# Patient Record
Sex: Female | Born: 1955
Health system: Southern US, Community
[De-identification: ages and names within clinical notes are randomized; demographics above are authoritative.]

## PROBLEM LIST (undated history)

## (undated) DIAGNOSIS — M810 Age-related osteoporosis without current pathological fracture: Secondary | ICD-10-CM

## (undated) DIAGNOSIS — K7689 Other specified diseases of liver: Secondary | ICD-10-CM

## (undated) DIAGNOSIS — Z972 Presence of dental prosthetic device (complete) (partial): Secondary | ICD-10-CM

## (undated) DIAGNOSIS — B009 Herpesviral infection, unspecified: Secondary | ICD-10-CM

## (undated) DIAGNOSIS — R748 Abnormal levels of other serum enzymes: Secondary | ICD-10-CM

## (undated) DIAGNOSIS — K219 Gastro-esophageal reflux disease without esophagitis: Secondary | ICD-10-CM

## (undated) DIAGNOSIS — M1990 Unspecified osteoarthritis, unspecified site: Secondary | ICD-10-CM

## (undated) HISTORY — DX: Unspecified osteoarthritis, unspecified site: M19.90

## (undated) HISTORY — DX: Herpesviral infection, unspecified: B00.9

## (undated) HISTORY — DX: Age-related osteoporosis without current pathological fracture: M81.0

## (undated) HISTORY — DX: Presence of dental prosthetic device (complete) (partial): Z97.2

## (undated) HISTORY — DX: Other specified diseases of liver: K76.89

## (undated) HISTORY — DX: Abnormal levels of other serum enzymes: R74.8

## (undated) HISTORY — PX: COLONOSCOPY: SHX174

## (undated) HISTORY — PX: TUBAL LIGATION: SHX77

## (undated) HISTORY — PX: VAGINAL HYSTERECTOMY: SUR661

## (undated) HISTORY — DX: Gastro-esophageal reflux disease without esophagitis: K21.9

## (undated) HISTORY — PX: MULTIPLE TOOTH EXTRACTIONS: SHX2053

## (undated) HISTORY — PX: OTHER SURGICAL HISTORY: SHX169

## (undated) HISTORY — PX: WISDOM TOOTH EXTRACTION: SHX21

---

## 1997-11-17 ENCOUNTER — Ambulatory Visit (HOSPITAL_COMMUNITY): Admission: RE | Admit: 1997-11-17 | Discharge: 1997-11-17 | Payer: Self-pay | Admitting: Obstetrics and Gynecology

## 2000-02-14 ENCOUNTER — Ambulatory Visit (HOSPITAL_BASED_OUTPATIENT_CLINIC_OR_DEPARTMENT_OTHER): Admission: RE | Admit: 2000-02-14 | Discharge: 2000-02-14 | Payer: Self-pay | Admitting: Orthopedic Surgery

## 2000-04-16 ENCOUNTER — Ambulatory Visit (HOSPITAL_BASED_OUTPATIENT_CLINIC_OR_DEPARTMENT_OTHER): Admission: RE | Admit: 2000-04-16 | Discharge: 2000-04-16 | Payer: Self-pay | Admitting: Orthopedic Surgery

## 2000-04-29 ENCOUNTER — Other Ambulatory Visit: Admission: RE | Admit: 2000-04-29 | Discharge: 2000-04-29 | Payer: Self-pay | Admitting: Obstetrics and Gynecology

## 2001-05-20 ENCOUNTER — Encounter: Payer: Self-pay | Admitting: Obstetrics and Gynecology

## 2001-05-20 ENCOUNTER — Ambulatory Visit (HOSPITAL_COMMUNITY): Admission: RE | Admit: 2001-05-20 | Discharge: 2001-05-20 | Payer: Self-pay | Admitting: Obstetrics and Gynecology

## 2001-05-26 ENCOUNTER — Encounter: Admission: RE | Admit: 2001-05-26 | Discharge: 2001-05-26 | Payer: Self-pay | Admitting: Obstetrics and Gynecology

## 2001-05-26 ENCOUNTER — Encounter: Payer: Self-pay | Admitting: Obstetrics and Gynecology

## 2005-06-17 ENCOUNTER — Observation Stay (HOSPITAL_COMMUNITY): Admission: RE | Admit: 2005-06-17 | Discharge: 2005-06-18 | Payer: Self-pay | Admitting: Obstetrics and Gynecology

## 2005-06-17 ENCOUNTER — Encounter (INDEPENDENT_AMBULATORY_CARE_PROVIDER_SITE_OTHER): Payer: Self-pay | Admitting: Specialist

## 2005-08-16 ENCOUNTER — Encounter: Admission: RE | Admit: 2005-08-16 | Discharge: 2005-08-16 | Payer: Self-pay | Admitting: Obstetrics and Gynecology

## 2006-09-18 ENCOUNTER — Encounter: Admission: RE | Admit: 2006-09-18 | Discharge: 2006-09-18 | Payer: Self-pay | Admitting: Obstetrics and Gynecology

## 2007-12-02 ENCOUNTER — Encounter: Admission: RE | Admit: 2007-12-02 | Discharge: 2007-12-02 | Payer: Self-pay | Admitting: Obstetrics and Gynecology

## 2008-02-03 ENCOUNTER — Ambulatory Visit: Payer: Self-pay | Admitting: Internal Medicine

## 2008-02-03 DIAGNOSIS — K625 Hemorrhage of anus and rectum: Secondary | ICD-10-CM | POA: Insufficient documentation

## 2008-02-03 DIAGNOSIS — K5900 Constipation, unspecified: Secondary | ICD-10-CM | POA: Insufficient documentation

## 2008-03-18 ENCOUNTER — Ambulatory Visit: Payer: Self-pay | Admitting: Internal Medicine

## 2008-03-18 ENCOUNTER — Encounter: Payer: Self-pay | Admitting: Internal Medicine

## 2008-03-27 ENCOUNTER — Encounter: Payer: Self-pay | Admitting: Internal Medicine

## 2008-05-27 ENCOUNTER — Ambulatory Visit (HOSPITAL_COMMUNITY): Admission: RE | Admit: 2008-05-27 | Discharge: 2008-05-27 | Payer: Self-pay | Admitting: Family Medicine

## 2008-05-27 ENCOUNTER — Encounter: Admission: RE | Admit: 2008-05-27 | Discharge: 2008-05-27 | Payer: Self-pay | Admitting: Family Medicine

## 2008-12-29 ENCOUNTER — Encounter: Admission: RE | Admit: 2008-12-29 | Discharge: 2008-12-29 | Payer: Self-pay | Admitting: Obstetrics and Gynecology

## 2009-07-07 ENCOUNTER — Ambulatory Visit (HOSPITAL_BASED_OUTPATIENT_CLINIC_OR_DEPARTMENT_OTHER): Admission: RE | Admit: 2009-07-07 | Discharge: 2009-07-07 | Payer: Self-pay | Admitting: Orthopedic Surgery

## 2009-08-05 IMAGING — MG MM SCREEN MAMMOGRAM BILATERAL
4 series · 4 of 4 positions shown · non-contrast
Comparison: none

DG SCREEN MAMMOGRAM BILATERAL
Bilateral CC and MLO view(s) were taken.

DIGITAL SCREENING MAMMOGRAM WITH CAD:
The breast tissue is heterogeneously dense.  No masses or malignant type calcifications are 
identified.  Compared with prior studies.

[R CC]
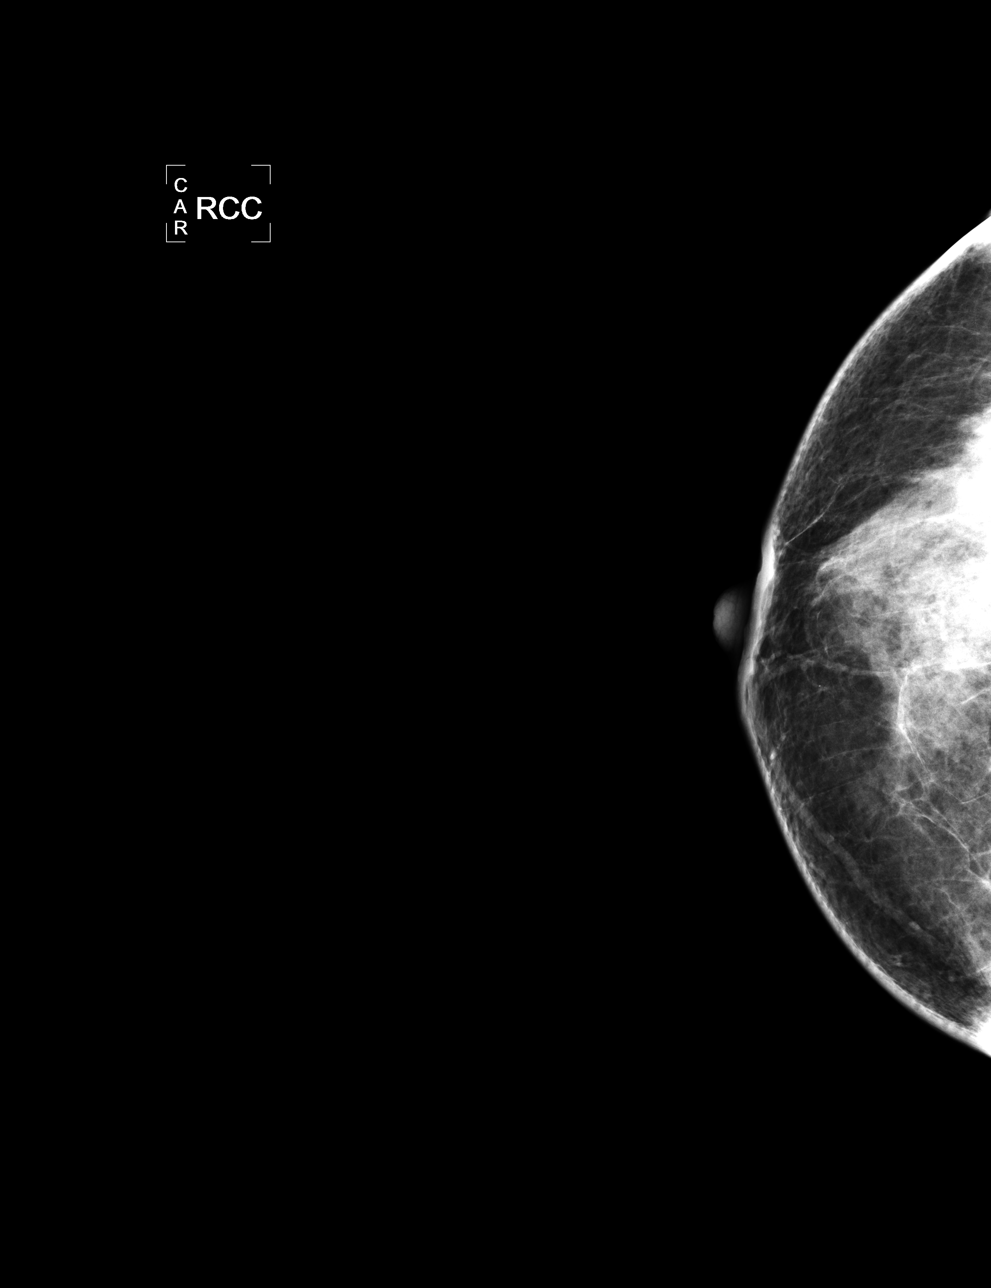

[L CC]
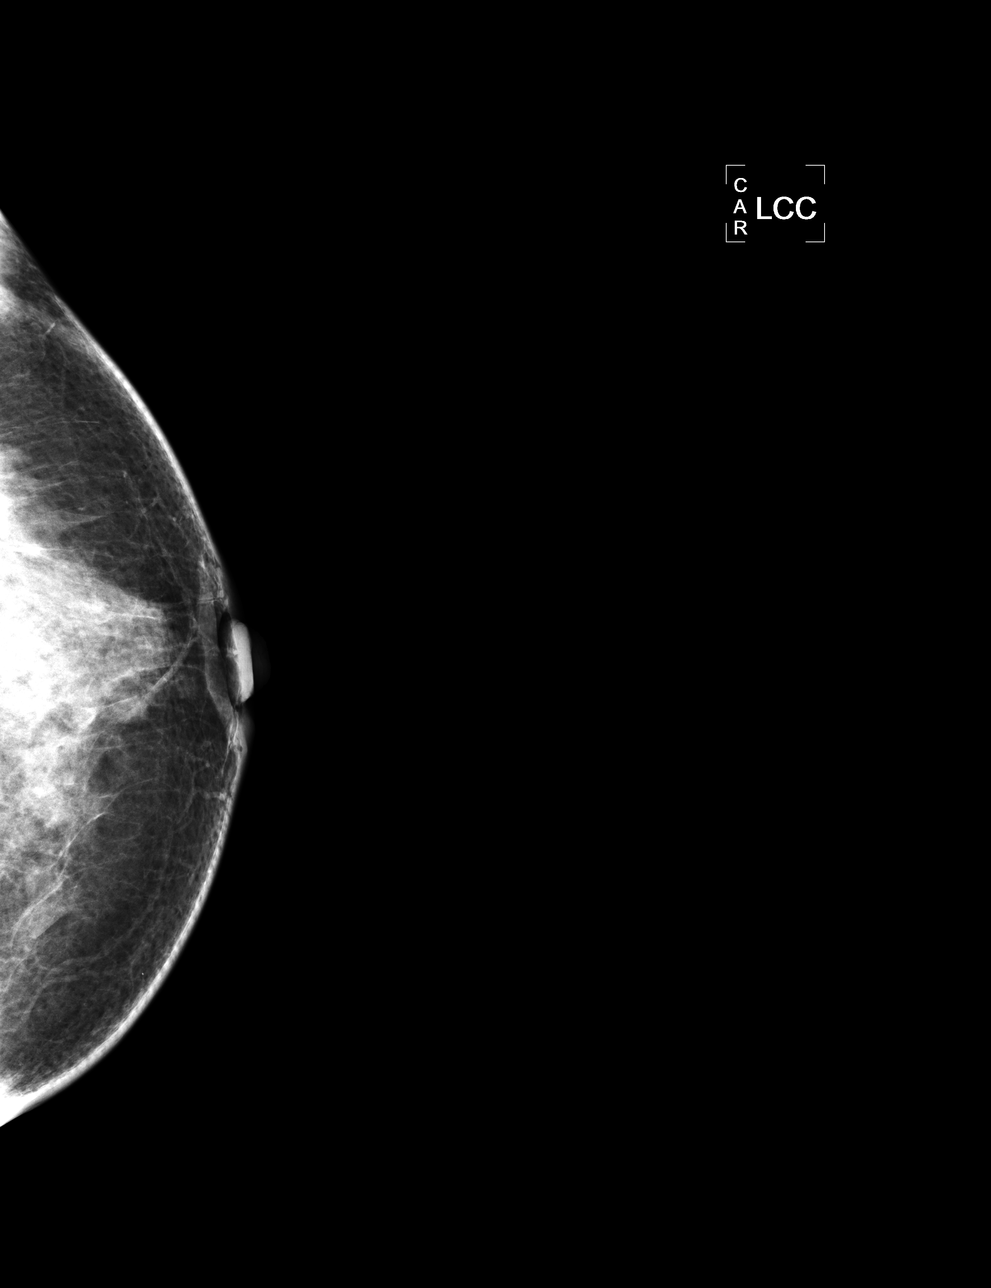

[L MLO]
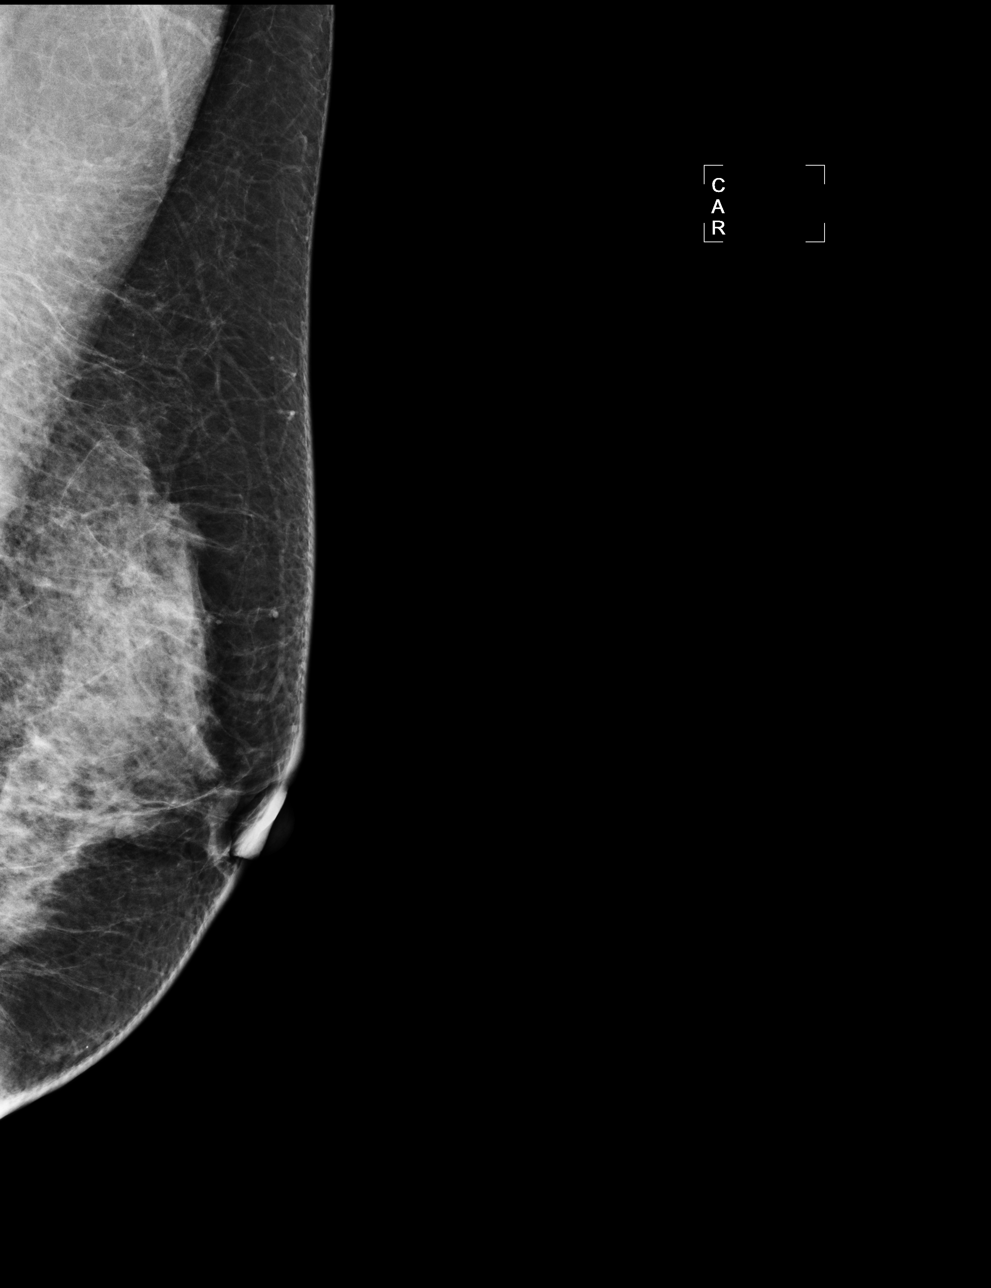

[R MLO]
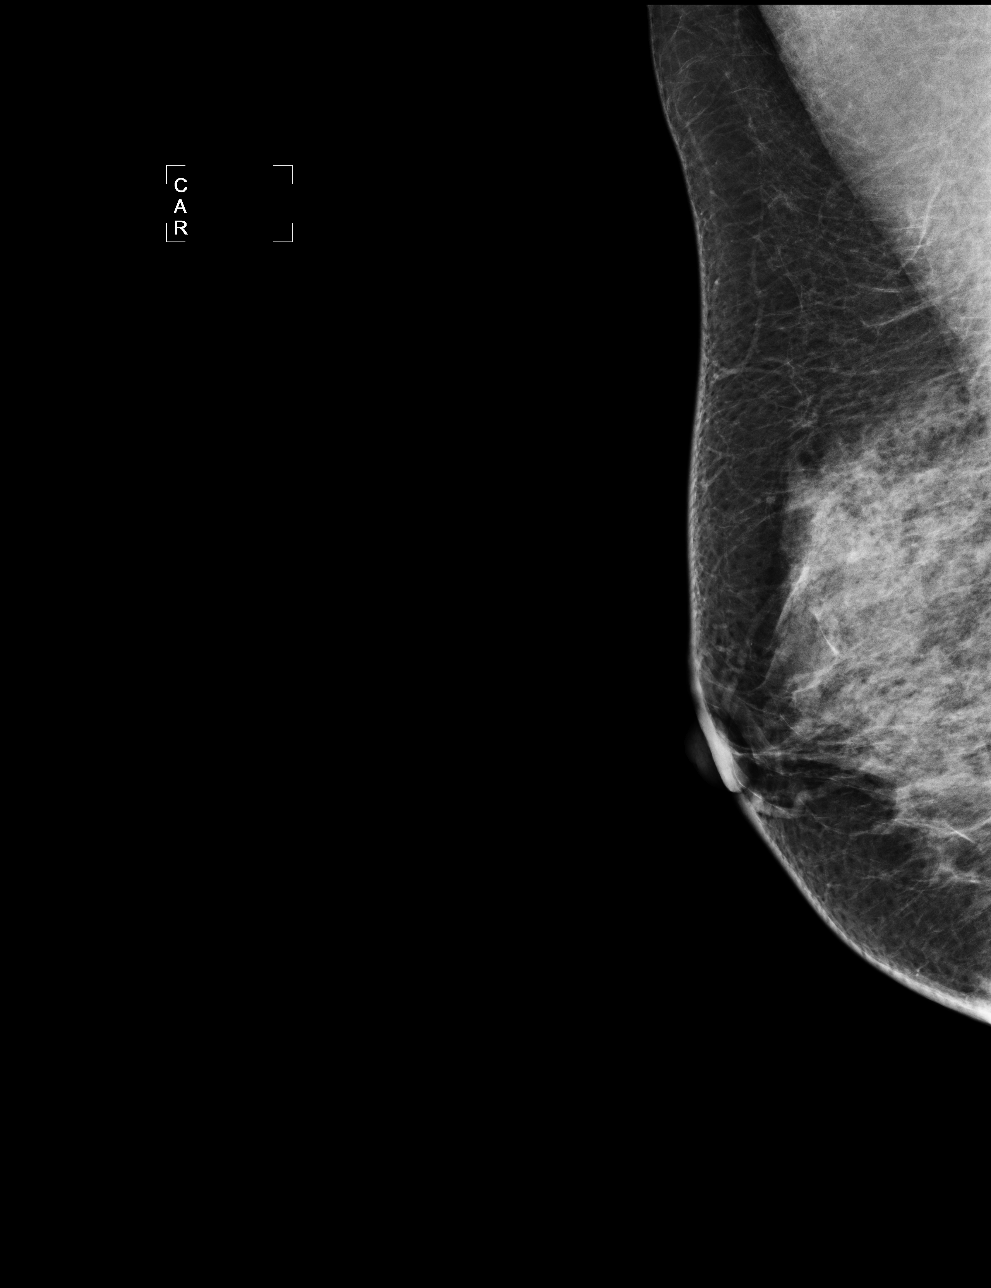

[4 of 4 positions shown; findings below may reference images not displayed]

IMPRESSION: No specific mammographic evidence of malignancy.  Next screening mammogram is recommended in one 
year.

A result letter of this screening mammogram will be mailed directly to the patient.

ASSESSMENT: Negative - BI-RADS 1

Screening mammogram in 1 year.
ANALYZED BY COMPUTER AIDED DETECTION. , THIS PROCEDURE WAS A DIGITAL MAMMOGRAM.

## 2010-09-09 ENCOUNTER — Encounter: Payer: Self-pay | Admitting: Obstetrics and Gynecology

## 2010-09-12 ENCOUNTER — Other Ambulatory Visit: Payer: Self-pay | Admitting: Obstetrics and Gynecology

## 2010-09-12 DIAGNOSIS — Z1239 Encounter for other screening for malignant neoplasm of breast: Secondary | ICD-10-CM

## 2010-09-12 DIAGNOSIS — Z1231 Encounter for screening mammogram for malignant neoplasm of breast: Secondary | ICD-10-CM

## 2010-09-26 ENCOUNTER — Ambulatory Visit
Admission: RE | Admit: 2010-09-26 | Discharge: 2010-09-26 | Disposition: A | Discharge status: Home / Self Care | Payer: 59 | Source: Ambulatory Visit | Attending: Obstetrics and Gynecology | Admitting: Obstetrics and Gynecology

## 2010-09-26 DIAGNOSIS — Z1231 Encounter for screening mammogram for malignant neoplasm of breast: Secondary | ICD-10-CM

## 2010-11-21 LAB — POCT HEMOGLOBIN-HEMACUE: Hemoglobin: 14.1 g/dL (ref 12.0–15.0)

## 2011-01-04 NOTE — Op Note (Signed)
Alexa Allen, Alexa Allen           ACCOUNT NO.:  [id number]   MEDICAL RECORD NO.:  [id number]          PATIENT TYPE:  INP   LOCATION:  0003                         FACILITY:  [facility]   PHYSICIAN:  Malachi Pro. Ambrose Mantle, M.D. DATE OF BIRTH:  [birth year]   DATE OF PROCEDURE:  06/17/2005  DATE OF DISCHARGE:                                 OPERATIVE REPORT   PREOPERATIVE DIAGNOSES:  Menorrhagia, dysmenorrhea, abnormal uterine  bleeding, fibroids, stress urinary incontinence.   POSTOPERATIVE DIAGNOSES:  Menorrhagia, dysmenorrhea, abnormal uterine  bleeding, fibroids, stress urinary incontinence.   OPERATION:  Abdominal hysterectomy, bilateral salpingo-oophorectomy, sling  procedure by Dr. Vernie Ammons.   OPERATOR:  Malachi Pro. Ambrose Mantle, M.D.   ASSISTANT:  Huel Cote, M.D., for the sling procedure. Dr. Vernie Ammons.   ANESTHESIA:  General anesthesia.   The patient was brought to the operating room and placed under satisfactory  general anesthesia. She did have an irregular heart beat during induction.  She was given general anesthesia, intubated, placed in the [place] starts. The  abdomen, vulva, vagina and urethra were prepped with Betadine solution and a  Foley catheter was inserted to straight drain. The patient was kept in the  [place] stirrups for the sling procedure. Her legs were taken down somewhat  similar to a regular supine position. The abdomen was then draped as a  sterile field and a transverse incision was made and carried in layers  through the skin, subcutaneous tissue and fascia. The fascia was then  separated from the rectus muscles superiorly and inferiorly. The rectus  muscle was split in the midline and the peritoneum was opened vertically.  Exploration of the upper abdomen revealed some adhesions around the liver.  The liver was smooth. I did not feel the gallbladder. Both kidneys felt  normal. At least I felt the right kidney, I am not sure about the left.  Exploration of the  pelvis revealed small adhesions from the pelvic wall to  the adnexa where she had had a previous tubal ligation. These were divided.  One pack was used for exposure as well as the retractor. The retractor was  elevated with towels to try to be sure that it did not put any pressure on  the pelvic vessels. The uterus was probably 9 weeks size with multiple  fibroids, especially in the lower uterine segment. She did show evidence of  previous tubal ligation, both ovaries appeared normal and the cul-de-sacs  were free of disease. The uterus was elevated into the operative field, both  round ligaments were divided with the electrical current and a bladder flap  was developed. The infundibulopelvic ligaments were then doubly suture  ligated after cutting them between clamps. The uterine vessels were  skeletonized, clamped, cut and suture ligated. Both ureters were identified  and appeared normal in their course and contour and caliber. The uterosacral  ligaments were clamped, cut, suture ligated and held. The vaginal angle was  entered on the left and then the uterus, tubes and ovaries were removed by  transecting the upper vagina. Vaginal angled sutures were placed and then  the central portion of the vagina was  closed with interrupted figure-of-  eight sutures of #0 Vicryl. A couple of extra sutures were required for  complete hemostasis, liberal irrigation confirmed hemostasis. The  uterosacral ligaments were sutured together in the midline with a suture of  #0 Vicryl to help give support to the vaginal cuff and decrease the  likelihood of enterocele formation. Reperitonealization was done across the  vaginal cuff, all pedicles appeared dry. The ureters appeared normal. The  pack and retractor were removed and the peritoneum was closed with a running  suture of #0 Vicryl, rectus muscle with interrupted #0 Vicryl, fascia with  two running sutures of #0Vicryl, subcu with a running 3-0 Vicryl and  staples  were used for the skin. The patient seemed to tolerate the procedure well.  Blood loss was estimated at about 200 mL. Sponge and needle counts were  correct and she returned to recovery in satisfactory condition. Dr. Vernie Ammons  has started his procedure and he will dictate his procedure separately.      Malachi Pro. Ambrose Mantle, M.D.  Electronically Signed     TFH/MEDQ  D:  06/17/2005  T:  06/17/2005  Job:  [id number]

## 2011-01-04 NOTE — Op Note (Signed)
[place]. [facility]  Patient:    Alexa Allen, Alexa Allen                    MRN: [id number] Proc. Date: 02/14/00 Adm. Date:  [id number] Attending:  Colbert Ewing                           Operative Report  PREOPERATIVE DIAGNOSIS:  Symptomatic calcific rotator cuff tendonitis, left shoulder.  POSTOPERATIVE DIAGNOSES:  Symptomatic calcific rotator cuff tendonitis, left shoulder, with secondary adhesive capsulitis and full-thickness tear rotator cuff.  Chronic impingement.  PROCEDURES:  Left shoulder exam under anesthesia arthroscopy with arthroscopic debridement of calcific tendonitis rotator cuff.  Arthroscopic acromioplasty. Repair of defect in tendon from calcific excision utilizing a Bio-Absorbable corkscrew, Arthrex type.  SURGEON:  Loreta Ave, M.D.  ASSISTANT:  Arlys John D. Petrarca, P.A.-C.  ANESTHESIA:  General.  ESTIMATED BLOOD LOSS:  Minimal.  SPECIMENS:  None.  CULTURES:  None.  COMPLICATIONS:  None.  DRESSING:  Self-compressive with sling.  DESCRIPTION OF PROCEDURE:  The patient was brought to the operating room, and after adequate anesthesia had been obtained, the left shoulder was examined. Marked, restrictive, adhesive capsulitis and bursitis limiting motion more than 50%.  The arm was manipulated gentle, breaking up all adhesions and achieving full motion with good stability and without adverse occurrence. Placed in a beach chair position on the shoulder positioner.  Prepped and draped in the usual sterile fashion.  Three standard arthroscopic portals, anterior, posterior, and lateral.  The shoulder was entered and distended. Bleeding and inflammatory tissue from adhesive capsulitis all debrided. Biceps tendon, articular cartilage, capsular ligamentous structures, and labrum from below looked good.  The rotator cuff from below had evidence of thickening in the region of the calcific deposit, which was isolated  with fluoroscopy and a small spinal needle.  There were no defects on the bottom of the tendon with initial inspection.  All hypertrophic synovial tissue was removed.  Cannula redirected subacromially.  The bursal tissue with associated bleeding, inflammatory tissue, and adhesions were all debrided.  Type II acromion with evidence of chronic impingement.  Acromion treated with acromioplasty, converting to a type I acromion with shaver and high speed bur. The top of the cuff was debrided.  The calcific deposit was isolated.  It was then carefully debrided out from the cuff utilizing a shaver and spinal needle.  Fluoroscopy was used to confirm that we had excised all of the calcific material, but this left me with a defect in the crescent region of the cuff in the central portion of the supraspinatus tendon, which once the calcific deposit was debrided, represented a full-thickness tear of the cuff within the crescent region with minimal retraction.  This was treated with roughening of the tuberosity off of the articular cartilage surface, and then with the Arthrex Bio-Absorbable screw system.  The remaining cuff adjacent to the defect was captured, brought over to the tuberosity region, then fixed initially with a guide wire and then a Bio-Absorbable corkscrew, which firmly seated the cuff back down to its originally attachment on roughened bone that had been created in that area.  This gave me a nice, firm, watertight closure and repair of the cuff for the defect that was present after debridement of the calcific deposit.  No impingement after completion.  Adequacy of repair confirmed subacromially and within the joint.  Instruments and fluid removed. Portals, shoulder, and bursa were  injected with Marcaine.  The lateral portal had been extended for the placement of the Bio-Absorbable screw.  All portals were closed with nylon.  A sterile, compressive dressing with sling was applied.   Anesthesia was reversed.  The patient was brought to the recovery room, and she tolerated surgery well with no complications. DD:  02/14/00 TD:  02/15/00 Job: [id number]

## 2011-01-04 NOTE — Op Note (Signed)
[place]  Patient:    Alexa Allen, Alexa Allen                    MRN: [id number] Proc. Date: 04/16/00 Adm. Date:  [id number] Attending:  Colbert Ewing                           Operative Report  PREOPERATIVE DIAGNOSIS:  Status post rotator cuff repair left shoulder with arthrofibrosis.  POSTOPERATIVE DIAGNOSIS:  Status post rotator cuff repair left shoulder with arthrofibrosis.  OPERATIVE PROCEDURE:  Examination and manipulation of left shoulder under anesthesia with subacromial injection of Aristopan and Marcaine.  SURGEON:  Loreta Ave, M.D.  ANESTHESIA:  General.  PROCEDURE:  Patient brought to the operating room and after adequate anesthesia had been obtained, left shoulder examined.  Restricted motion 50% all planes. Although there were abrupt adhesions, these were relatively soft and with very gentle manipulation full motion achieved with breaking up of primarily subacromial adhesions.  No adverse occurrence.  Full motion, stable shoulder at completion.  Under sterile technique, she was injected subacromially with a mixture of Aristopan and Marcaine.  Anesthesia reversed, brought to recovery room.  Tolerated surgery well, no complications. DD:  04/16/00 TD:  04/17/00 Job: [id number]

## 2011-01-04 NOTE — H&P (Signed)
NAME:  Alexa Allen, Alexa Allen           ACCOUNT NO.:  [id number]   MEDICAL RECORD NO.:  [id number]          PATIENT TYPE:  INP   LOCATION:  NA                           FACILITY:  [facility]   PHYSICIAN:  Malachi Pro. Ambrose Mantle, M.D. DATE OF BIRTH:  08-11-1956   DATE OF ADMISSION:  06/17/2005  DATE OF DISCHARGE:                                HISTORY & PHYSICAL   HISTORY OF PRESENT ILLNESS:  This is a 55 year old white female para 2, 0,  0, 2, admitted to the hospital for abdominal hysterectomy and bilateral  salpingo-oophorectomy with a sling procedure because of severe menorrhagia  dysmenorrhea, and stress urinary incontinence. Last menstrual period June 01, 2005. Period occur every 16 to 28 days and last 6 days. Periods are very  heavy. She uses 7-10 pads per days for the first 4 days. The periods are  associated with severe pain, ranking 8 to 10 out of 10. Because of her  abnormal uterine bleeding she underwent endometrial biopsy on May 02, 2005 that showed benign secretory endometrium. An ultrasound on May 13, 2005 showed multiple fibroids and an echo free cyst of the left ovary,  but no evidence of submucous fibroids. The patient has had stress urinary  incontinence for quite some time. She was evaluated by Dr. Vernie Ammons, and  because he was going to be out of town on the day of surgery, her sling  procedure is being done by Dr. Brunilda Payor.   PAST MEDICAL HISTORY:   ALLERGIES:  IVP DYE AND PERCOCET.   MEDICATIONS:  Vitamins.   OPERATION:  Left rotator cuff surgery x2. Tubal ligation.   FAMILY HISTORY:  Mother is 29 with asthma, emphysema and bronchitis. Father  died at 66 of esophageal cancer. Three sisters are living and well. Three  brothers are living, one has muscular dystrophy.   SOCIAL HISTORY:  The patient smokes half a pack of cigarettes a day. Rarely  drinks alcohol.   She has history of no significant illnesses. No heart problems. Has a  negative review of systems  except as in the present illness. The patient  does report almost no sex drive. At times over the years she has answered  the question differently but at the present time she states that she would  desire no sex and previously she had at least some desire.   PHYSICAL EXAMINATION:  GENERAL:  Well-developed, slender white female, 126  pounds. Pulse 80, blood pressure 110/80.  HEENT:  No cranial abnormalities revealed. Extraocular movements are intact.  Nose and pharynx clear.  NECK:  Supple without thyromegaly.  HEART:  Normal size and sounds, no murmurs.  LUNGS:  Clear to auscultation.  BREASTS:  Soft without masses, lying down or sitting up.  ABDOMEN:  Soft and nontender. There seems to be a palpable mass in the right  lower abdomen just to the right of the midline.   PELVIC:  Pap smear on October 09, 2004 was negative for intraepithelial  lesion and malignancy. The vulva and vagina are clean, there is good  support, the cervix is clean, the uterus is anterior, thought to be  about  two times normal size. The adnexa are clear.  RECTAL:  Rectal exam in February of 2006 was negative.   ADMITTING IMPRESSIONS:  Menorrhagia, dysmenorrhea, abnormal uterine  bleeding, fibroids, stress urinary incontinence.   The patient has been counseled regarding saving or removing the ovaries. She  elects to have the ovaries removed. She understands the risks of surgery  include but are not limited to heart attack, stroke, pulmonary emboli's,  wound disruption, hemorrhage with need for re-operation and/or transfusion,  fistula formation, nerve injury, intestinal obstruction. She understands and  agrees to proceed. She also understands that the surgery may alter her sex  drive even though she has no sex drive to start with.      Malachi Pro. Ambrose Mantle, M.D.  Electronically Signed     TFH/MEDQ  D:  06/16/2005  T:  06/16/2005  Job:  [id number]   cc:   Lindaann Slough, M.D.  Fax: [phone]

## 2011-01-04 NOTE — Op Note (Signed)
NAME:  Alexa Allen, Alexa Allen           ACCOUNT NO.:  [id number]   MEDICAL RECORD NO.:  [id number]          PATIENT TYPE:  INP   LOCATION:  0003                         FACILITY:  [facility]   PHYSICIAN:  Lindaann Slough, M.D.  DATE OF BIRTH:  [birth year]   DATE OF PROCEDURE:  06/17/2005  DATE OF DISCHARGE:                                 OPERATIVE REPORT   PREOPERATIVE DIAGNOSIS:  Urinary stress incontinence.   POSTOPERATIVE DIAGNOSIS:  Urinary stress incontinence.   PROCEDURE:  Transobturator sling and cystoscopy.   SURGEON:  Danae Chen, M.D.   ANESTHESIA:  General.   INDICATIONS FOR PROCEDURE:  This patient is a 55 year old female who had  been complaining of urinary incontinence.  She leaks with coughing,  sneezing, and laughing.  This has been going on for about a year and it has  progressively worsened.  She was found on physical examination to have a  cystocele.  Urodynamic studies shows stress incontinence and unstable  contractions.  The patient is scheduled for total abdominal hysterectomy by  Malachi Pro. Ambrose Mantle, M.D. and transobturator sling.  The patient was originally  scheduled to have the procedure done by [facility] C. Vernie Ammons, M.D.  However, he  is out this week and he had asked me to do the procedure.  I had discussed  the procedure, the risks, and the benefits with the patient and she is in  agreement.   DESCRIPTION OF PROCEDURE:  The total abdominal hysterectomy was already done  by Malachi Pro. Ambrose Mantle, M.D.  The patient was then placed in the dorsal  lithotomy position.  The labia majora were approximated to the inner thighs  with 2-0 silk.  A #16 Foley catheter was inserted in the bladder.  A vaginal  speculum was then placed in the vagina.  The vaginal mucosa was then  infiltrated with 1% lidocaine with epinephrine.  Then a 2 cm incision was  made at the level of the mid urethra.  The incision was carried down to the  subcutaneous tissues.  The vaginal mucosa was then  dissected from the  periurethral tissues with sharp dissection.  The dissection was carried to  the level of the inferior pubic rami.  Then a skin incision was made at  about Allen cm from the midline at the level of the clitoris.  Then the curved  needle was passed through the skin incision, through the obturator foramen  and through the vaginal incision on the left side.  The same procedure was  done on the right side.  The Foley catheter was then removed.  A cystoscope  was then inserted in the bladder.  The bladder mucosa is normal.  There is  no stone or tumor in the bladder.  The ureteral orifices are in normal  position and shape with clear efflux.  There is no evidence of penetration  of the bladder nor the urethra with the needles.  The bladder was examined  with both the foroblique and the right angle lenses.  Then the  mesh was  passed through the eyes of the needles and pulled through the skin incision.  A needle holder was then passed between the urethra and the mesh.  The mesh  was then cut at the skin level.  I easily could pass the needle holder  between the mesh and the urethra.  The wound was irrigated with bug juice.  Then the vaginal incision was closed with #2-0 Vicryl and the skin incisions  were closed with 4-0 Monocryl.  A #16 Foley catheter was then reinserted in  the bladder.   A vaginal packing with Estrace cream was then placed in the vagina.   The patient tolerated the procedure well and left the OR in satisfactory  condition to postanesthesia care unit.      Lindaann Slough, M.D.  Electronically Signed     MN/MEDQ  D:  06/17/2005  T:  06/17/2005  Job:  [id number]   cc:   Malachi Pro. Ambrose Mantle, M.D.  Fax: [phone]

## 2011-01-04 NOTE — Discharge Summary (Signed)
NAMEFABIANNA, KEATS NO.:  [id number]   MEDICAL RECORD NO.:  [id number]          PATIENT TYPE:  INP   LOCATION:  1604                         FACILITY:  [facility]   PHYSICIAN:  Malachi Pro. Ambrose Mantle, M.D. DATE OF BIRTH:  [birth year]   DATE OF ADMISSION:  06/17/2005  DATE OF DISCHARGE:  06/18/2005                                 DISCHARGE SUMMARY   This is a 55 year old white female admitted to the hospital for abdominal  hysterectomy, bilateral salpingo-oophorectomy because of severe menorrhagia  and dysmenorrhea, multiple fibroids, presumed adenomyosis and sling  procedure by Dr. Brunilda Payor for stress urinary incontinence. The patient underwent  the procedure on June 17, 2005.  She did well postoperatively, her  catheter was removed on the first postoperative morning. She has voided  well, she feels like she is emptying her bladder, she is afebrile,  tolerating a regular diet. She has ambulated well without difficulty and she  feels ready for discharge. Comprehensive metabolic profile is normal.  Urinalysis negative. Hemoglobin 14.0, hematocrit 41.9, white count 6200,  platelet count 217,000, normal differential. Follow-up hematocrits were 36  and 34. Path report showed uterus with multiple fibroids and adenomyosis,  benign cervix, benign fallopian tubes and ovaries.   FINAL DIAGNOSES:  Menorrhagia, dysmenorrhea, adenomyosis, multiple fibroids,  stress urinary incontinence.   OPERATION:  Abdominal hysterectomy, bilateral salpingo-oophorectomy, sling  procedure.   FINAL CONDITION:  Improved.   INSTRUCTIONS:  Regular discharge instructions. No vaginal entrance, no heavy  lifting or strenuous activity. Call with any fever above 100.4 degrees. Call  with any unusual problems Darvocet-N 100, 24 tablets, 1 every 4-6 hours as  needed for pain is given at discharge with 1 refill. The patient is advised  to see me in 3 days for follow-up examination .      Malachi Pro. Ambrose Mantle,  M.D.  Electronically Signed     TFH/MEDQ  D:  06/18/2005  T:  06/19/2005  Job:  [id number]   cc:   Lindaann Slough, M.D.  Fax: [phone]

## 2011-05-24 ENCOUNTER — Other Ambulatory Visit: Payer: Self-pay | Admitting: Dermatology

## 2013-04-02 ENCOUNTER — Ambulatory Visit: Payer: Self-pay | Admitting: Family Medicine

## 2013-06-09 ENCOUNTER — Telehealth: Payer: Self-pay | Admitting: Family Medicine

## 2013-06-09 ENCOUNTER — Other Ambulatory Visit: Payer: Self-pay | Admitting: Family Medicine

## 2013-06-09 MED ORDER — VALACYCLOVIR HCL 500 MG PO TABS: 500.0000 mg | ORAL_TABLET | Freq: Two times a day (BID) | ORAL | Status: DC

## 2013-06-09 NOTE — Telephone Encounter (Signed)
Medication refilled per protocol. 

## 2013-06-09 NOTE — Telephone Encounter (Signed)
Valacyclovir 500 mg one BID x 3 days

## 2013-06-18 ENCOUNTER — Telehealth: Payer: Self-pay | Admitting: Family Medicine

## 2013-06-18 MED ORDER — VALACYCLOVIR HCL 500 MG PO TABS: 500.0000 mg | ORAL_TABLET | Freq: Two times a day (BID) | ORAL | Status: DC

## 2013-06-18 NOTE — Telephone Encounter (Signed)
Medication refilled per protocol. 

## 2013-07-14 ENCOUNTER — Telehealth: Payer: Self-pay | Admitting: Family Medicine

## 2013-07-14 MED ORDER — VALACYCLOVIR HCL 500 MG PO TABS: 500.0000 mg | ORAL_TABLET | Freq: Two times a day (BID) | ORAL | Status: DC

## 2013-07-14 NOTE — Telephone Encounter (Signed)
Pt is needing valtrex refilled Call back number is [phone] Pharmacy [facility] [place]

## 2013-07-14 NOTE — Telephone Encounter (Signed)
Rx Refilled  

## 2013-08-06 ENCOUNTER — Ambulatory Visit (INDEPENDENT_AMBULATORY_CARE_PROVIDER_SITE_OTHER): Payer: 59 | Admitting: Family Medicine

## 2013-08-06 ENCOUNTER — Encounter: Payer: Self-pay | Admitting: Family Medicine

## 2013-08-06 VITALS — BP 110/72 | HR 72 | Temp 97.4°F | Resp 16 | Ht 62.0 in | Wt 145.0 lb

## 2013-08-06 DIAGNOSIS — B009 Herpesviral infection, unspecified: Secondary | ICD-10-CM | POA: Insufficient documentation

## 2013-08-06 MED ORDER — VALACYCLOVIR HCL 500 MG PO TABS: 500.0000 mg | ORAL_TABLET | Freq: Every day | ORAL | Status: DC

## 2013-08-06 NOTE — Progress Notes (Signed)
   Subjective:    Patient ID: Alexa Allen, female    DOB: September 12, 1955, 57 y.o.   MRN: [id number]  HPI Patient has a history of HSV infections on the left gluteus. He said they were very random and regular. However now she is getting an infection once every month. They began as an erythematous patch that itches. Soon vesicles appear and they burn and then rupture. The infection has been verified with bile culture. At present she is taking medicines to suppress outbreaks. However due to the frequency of the outbreaks she is interested in suppressive therapy. Past Medical History  Diagnosis Date  . HSV (herpes simplex virus) infection    Current Outpatient Prescriptions on File Prior to Visit  Medication Sig Dispense Refill  . valACYclovir (VALTREX) 500 MG tablet Take 1 tablet (500 mg total) by mouth 2 (two) times daily.  6 tablet  0   No current facility-administered medications on file prior to visit.   Allergies  Allergen Reactions  . Iohexol      Code: HIVES, Desc: hives with IV cm- tolerated 1 hour quick prep well, Onset Date: [id number]    History   Social History  . Marital Status: Married    Spouse Name: N/A    Number of Children: N/A  . Years of Education: N/A   Occupational History  . Not on file.   Social History Main Topics  . Smoking status: Former Games developer  . Smokeless tobacco: Not on file  . Alcohol Use: Yes     Comment: Occasional  . Drug Use: No  . Sexual Activity: Not on file   Other Topics Concern  . Not on file   Social History Narrative  . No narrative on file      Review of Systems  All other systems reviewed and are negative.       Objective:   Physical Exam  Vitals reviewed. Cardiovascular: Normal rate and regular rhythm.   Pulmonary/Chest: Effort normal and breath sounds normal.  Skin: Rash noted. There is erythema.   erythematous papules in a cluster on the left gluteus        Assessment & Plan:  1. Herpes Begin Valtrex 500  mg by mouth daily for suppressive therapy. The risk and benefits of present discussed at length and the patient elected to take Valtrex 500 mg by mouth daily. - valACYclovir (VALTREX) 500 MG tablet; Take 1 tablet (500 mg total) by mouth daily.  Dispense: 30 tablet; Refill: 11

## 2014-07-26 DIAGNOSIS — R590 Localized enlarged lymph nodes: Secondary | ICD-10-CM | POA: Insufficient documentation

## 2014-08-17 ENCOUNTER — Other Ambulatory Visit: Payer: Self-pay | Admitting: Family Medicine

## 2014-08-29 ENCOUNTER — Other Ambulatory Visit: Payer: Self-pay | Admitting: Family Medicine

## 2014-08-30 NOTE — Telephone Encounter (Signed)
Refill appropriate and filled per protocol. 

## 2014-10-17 ENCOUNTER — Other Ambulatory Visit: Payer: Self-pay | Admitting: Family Medicine

## 2014-10-17 MED ORDER — VALACYCLOVIR HCL 500 MG PO TABS: 500.0000 mg | ORAL_TABLET | Freq: Every day | ORAL | Status: DC

## 2014-10-17 NOTE — Telephone Encounter (Signed)
Med sen to pharm

## 2014-10-19 ENCOUNTER — Telehealth: Payer: Self-pay | Admitting: Family Medicine

## 2014-10-19 ENCOUNTER — Encounter: Payer: Self-pay | Admitting: Family Medicine

## 2014-10-19 NOTE — Telephone Encounter (Signed)
Refill for Valtrex denied.  Pt has not been seen in since 07/2013.  Must be seen for refills.  Letter to patient to make appt.

## 2014-11-18 ENCOUNTER — Other Ambulatory Visit: Payer: Self-pay | Admitting: Dermatology

## 2015-01-27 ENCOUNTER — Encounter: Payer: Self-pay | Admitting: Family Medicine

## 2015-01-30 ENCOUNTER — Encounter: Payer: Self-pay | Admitting: Internal Medicine

## 2015-04-21 ENCOUNTER — Encounter: Payer: Self-pay | Admitting: Family Medicine

## 2015-04-21 ENCOUNTER — Ambulatory Visit (INDEPENDENT_AMBULATORY_CARE_PROVIDER_SITE_OTHER): Payer: BLUE CROSS/BLUE SHIELD | Admitting: Family Medicine

## 2015-04-21 ENCOUNTER — Other Ambulatory Visit: Payer: Self-pay | Admitting: Family Medicine

## 2015-04-21 VITALS — BP 110/72 | HR 78 | Temp 98.3°F | Resp 18 | Ht 62.0 in | Wt 142.0 lb

## 2015-04-21 DIAGNOSIS — J019 Acute sinusitis, unspecified: Secondary | ICD-10-CM

## 2015-04-21 DIAGNOSIS — Z13220 Encounter for screening for lipoid disorders: Secondary | ICD-10-CM

## 2015-04-21 DIAGNOSIS — Z1329 Encounter for screening for other suspected endocrine disorder: Secondary | ICD-10-CM | POA: Diagnosis not present

## 2015-04-21 DIAGNOSIS — Z0000 Encounter for general adult medical examination without abnormal findings: Secondary | ICD-10-CM | POA: Diagnosis not present

## 2015-04-21 LAB — COMPLETE METABOLIC PANEL WITH GFR
ALT: 71 U/L — ABNORMAL HIGH (ref 6–29)
AST: 77 U/L — AB (ref 10–35)
Albumin: 4 g/dL (ref 3.6–5.1)
Alkaline Phosphatase: 98 U/L (ref 33–130)
BUN: 17 mg/dL (ref 7–25)
CALCIUM: 8.9 mg/dL (ref 8.6–10.4)
CHLORIDE: 104 mmol/L (ref 98–110)
CO2: 27 mmol/L (ref 20–31)
Creat: 0.64 mg/dL (ref 0.50–1.05)
GFR, Est Non African American: >89 mL/min (ref >=60)
Glucose, Bld: 85 mg/dL (ref 70–99)
POTASSIUM: 4.2 mmol/L (ref 3.5–5.3)
Sodium: 140 mmol/L (ref 135–146)
Total Bilirubin: 0.5 mg/dL (ref 0.2–1.2)
Total Protein: 6.5 g/dL (ref 6.1–8.1)

## 2015-04-21 LAB — LIPID PANEL
CHOL/HDL RATIO: 2.7 ratio (ref <=5.0)
CHOLESTEROL: 195 mg/dL (ref 125–200)
HDL: 73 mg/dL (ref >=46)
LDL Cholesterol: 108 mg/dL (ref <130)
TRIGLYCERIDES: 69 mg/dL (ref <150)
VLDL: 14 mg/dL (ref <30)

## 2015-04-21 LAB — CBC WITH DIFFERENTIAL/PLATELET
Basophils Absolute: 0 10*3/uL (ref 0.0–0.1)
Basophils Relative: 1 % (ref 0–1)
EOS ABS: 0.2 10*3/uL (ref 0.0–0.7)
EOS PCT: 5 % (ref 0–5)
HEMATOCRIT: 40.4 % (ref 36.0–46.0)
Hemoglobin: 13.7 g/dL (ref 12.0–15.0)
LYMPHS PCT: 32 % (ref 12–46)
Lymphs Abs: 1.3 10*3/uL (ref 0.7–4.0)
MCH: 29.8 pg (ref 26.0–34.0)
MCHC: 33.9 g/dL (ref 30.0–36.0)
MCV: 88 fL (ref 78.0–100.0)
MONO ABS: 0.6 10*3/uL (ref 0.1–1.0)
MPV: 11.4 fL (ref 8.6–12.4)
Monocytes Relative: 15 % — ABNORMAL HIGH (ref 3–12)
Neutro Abs: 1.9 10*3/uL (ref 1.7–7.7)
Neutrophils Relative %: 47 % (ref 43–77)
PLATELETS: 176 10*3/uL (ref 150–400)
RBC: 4.59 MIL/uL (ref 3.87–5.11)
RDW: 13.2 % (ref 11.5–15.5)
WBC: 4.1 10*3/uL (ref 4.0–10.5)

## 2015-04-21 LAB — TSH: TSH: 0.849 u[IU]/mL (ref 0.350–4.500)

## 2015-04-21 MED ORDER — AMOXICILLIN 875 MG PO TABS
875.0000 mg | ORAL_TABLET | Freq: Two times a day (BID) | ORAL | Status: DC
Start: 2015-04-21 — End: 2016-03-14

## 2015-04-21 MED ORDER — HYDROCOD POLST-CPM POLST ER 10-8 MG/5ML PO SUER: 5.0000 mL | Freq: Two times a day (BID) | ORAL | Status: DC

## 2015-04-21 NOTE — Progress Notes (Signed)
Subjective:    Patient ID: Alexa Allen, female    DOB: 06-Feb-1956, 59 y.o.   MRN: [id number]  HPI Here for CPE.  Last colonoscopy was 2009.  Polyp was hyperplastic.  Not due again until 2019.  Mammogram and pap smear are performed at her GYN office.  Last done in 2015.  She reports 5 days of sinus pain. She has pain in both maxillary sinuses and in her upper teeth. She reports a severe cough that is productive of yellow and clear sputum. She reports sinus pressure and sinus headaches she denies fever although she had fever earlier in the week. Past Medical History  Diagnosis Date  . HSV (herpes simplex virus) infection    No past surgical history on file. Current Outpatient Prescriptions on File Prior to Visit  Medication Sig Dispense Refill  . valACYclovir (VALTREX) 500 MG tablet Take 1 tablet (500 mg total) by mouth daily. 90 tablet 4   No current facility-administered medications on file prior to visit.   Allergies  Allergen Reactions  . Iohexol      Code: HIVES, Desc: hives with IV cm- tolerated 1 hour quick prep well, Onset Date: [id number]    Social History   Social History  . Marital Status: Married    Spouse Name: N/A  . Number of Children: N/A  . Years of Education: N/A   Occupational History  . Not on file.   Social History Main Topics  . Smoking status: Former Research scientist (life sciences)  . Smokeless tobacco: Not on file  . Alcohol Use: Yes     Comment: Occasional  . Drug Use: No  . Sexual Activity: Not on file   Other Topics Concern  . Not on file   Social History Narrative  . No narrative on file     Review of Systems  All other systems reviewed and are negative.      Objective:   Physical Exam  Constitutional: She is oriented to person, place, and time. She appears well-developed and well-nourished. No distress.  HENT:  Head: Normocephalic and atraumatic.  Right Ear: External ear normal.  Left Ear: External ear normal.  Nose: Nose normal.  Mouth/Throat:  Oropharynx is clear and moist. No oropharyngeal exudate.  Eyes: Conjunctivae and EOM are normal. Pupils are equal, round, and reactive to light. Right eye exhibits no discharge. Left eye exhibits no discharge. No scleral icterus.  Neck: Normal range of motion. Neck supple. No JVD present. No tracheal deviation present. No thyromegaly present.  Cardiovascular: Normal rate, regular rhythm, normal heart sounds and intact distal pulses.  Exam reveals no gallop and no friction rub.   No murmur heard. Pulmonary/Chest: Effort normal and breath sounds normal. No stridor. No respiratory distress. She has no wheezes. She has no rales. She exhibits no tenderness.  Abdominal: Soft. Bowel sounds are normal. She exhibits no distension and no mass. There is no tenderness. There is no rebound and no guarding.  Musculoskeletal: Normal range of motion. She exhibits no edema or tenderness.  Lymphadenopathy:    She has no cervical adenopathy.  Neurological: She is alert and oriented to person, place, and time. She has normal reflexes. She displays normal reflexes. No cranial nerve deficit. She exhibits normal muscle tone. Coordination normal.  Skin: Skin is warm. No rash noted. She is not diaphoretic. No erythema. No pallor.  Psychiatric: She has a normal mood and affect. Her behavior is normal. Judgment and thought content normal.  Vitals reviewed.  Assessment & Plan:  Routine general medical examination at a health care facility - Plan: CBC with Differential/Platelet, COMPLETE METABOLIC PANEL WITH GFR, Lipid panel  Screening cholesterol level - Plan: CBC with Differential/Platelet, COMPLETE METABOLIC PANEL WITH GFR, Lipid panel  Screening for thyroid disorder - Plan: TSH  physical exam is normal. Cancer screening is up-to-date. I recommended a flu shot but the patient gets this at work later in the year. I recommended a shingles vaccine next year. I will check a CBC, CMP, fasting lipid panel, and a  TSH. I believe the patient has a sinus infection. I told her to wait 3-4 more days and it should resolve on its own. In case things worsen over the weekend I did give her a prescription for amoxicillin with strict instructions not to fill it unless certain criteria are met. Also gave her cough medicine Tussionex 5 ML's by mouth every 12 hours when necessary cough

## 2015-04-25 LAB — HEPATITIS PANEL, ACUTE
HCV Ab: NEGATIVE
HEP A IGM: NONREACTIVE
HEP B S AG: NEGATIVE
Hep B C IgM: NONREACTIVE

## 2015-05-08 ENCOUNTER — Other Ambulatory Visit: Payer: Self-pay | Admitting: *Deleted

## 2015-05-08 DIAGNOSIS — R7989 Other specified abnormal findings of blood chemistry: Secondary | ICD-10-CM

## 2015-05-08 DIAGNOSIS — R945 Abnormal results of liver function studies: Principal | ICD-10-CM

## 2015-05-11 ENCOUNTER — Other Ambulatory Visit: Payer: Self-pay

## 2015-05-19 ENCOUNTER — Other Ambulatory Visit: Payer: Self-pay

## 2015-05-22 ENCOUNTER — Other Ambulatory Visit: Payer: BLUE CROSS/BLUE SHIELD

## 2015-05-22 DIAGNOSIS — R7989 Other specified abnormal findings of blood chemistry: Secondary | ICD-10-CM

## 2015-05-22 DIAGNOSIS — R945 Abnormal results of liver function studies: Principal | ICD-10-CM

## 2015-05-23 LAB — COMPLETE METABOLIC PANEL WITH GFR
ALBUMIN: 4.1 g/dL (ref 3.6–5.1)
ALK PHOS: 96 U/L (ref 33–130)
ALT: 68 U/L — ABNORMAL HIGH (ref 6–29)
AST: 68 U/L — AB (ref 10–35)
BUN: 16 mg/dL (ref 7–25)
CHLORIDE: 103 mmol/L (ref 98–110)
CO2: 27 mmol/L (ref 20–31)
Calcium: 9.4 mg/dL (ref 8.6–10.4)
Creat: 0.78 mg/dL (ref 0.50–1.05)
GFR, EST NON AFRICAN AMERICAN: 83 mL/min (ref >=60)
GFR, Est African American: >89 mL/min (ref >=60)
GLUCOSE: 95 mg/dL (ref 70–99)
POTASSIUM: 4.3 mmol/L (ref 3.5–5.3)
SODIUM: 140 mmol/L (ref 135–146)
Total Bilirubin: 0.4 mg/dL (ref 0.2–1.2)
Total Protein: 6.6 g/dL (ref 6.1–8.1)

## 2015-05-23 LAB — HEPATITIS PANEL, ACUTE
HCV Ab: NEGATIVE
Hep A IgM: NONREACTIVE
Hep B C IgM: NONREACTIVE
Hepatitis B Surface Ag: NEGATIVE

## 2015-05-26 ENCOUNTER — Ambulatory Visit
Admission: RE | Admit: 2015-05-26 | Discharge: 2015-05-26 | Disposition: A | Discharge status: Home / Self Care | Payer: BLUE CROSS/BLUE SHIELD | Source: Ambulatory Visit | Attending: Family Medicine | Admitting: Family Medicine

## 2015-05-26 DIAGNOSIS — R945 Abnormal results of liver function studies: Principal | ICD-10-CM

## 2015-05-26 DIAGNOSIS — R7989 Other specified abnormal findings of blood chemistry: Secondary | ICD-10-CM

## 2015-05-29 ENCOUNTER — Encounter: Payer: Self-pay | Admitting: Family Medicine

## 2015-05-31 ENCOUNTER — Encounter: Payer: Self-pay | Admitting: *Deleted

## 2016-01-02 ENCOUNTER — Other Ambulatory Visit: Payer: Self-pay | Admitting: Family Medicine

## 2016-01-02 NOTE — Telephone Encounter (Signed)
Refill appropriate and filled per protocol. 

## 2016-03-14 ENCOUNTER — Encounter: Payer: Self-pay | Admitting: Family Medicine

## 2016-03-14 ENCOUNTER — Ambulatory Visit (INDEPENDENT_AMBULATORY_CARE_PROVIDER_SITE_OTHER): Payer: BLUE CROSS/BLUE SHIELD | Admitting: Family Medicine

## 2016-03-14 VITALS — BP 126/62 | HR 88 | Temp 98.6°F | Resp 16 | Ht 62.0 in | Wt 152.0 lb

## 2016-03-14 DIAGNOSIS — L259 Unspecified contact dermatitis, unspecified cause: Secondary | ICD-10-CM | POA: Diagnosis not present

## 2016-03-14 MED ORDER — METHYLPREDNISOLONE ACETATE 40 MG/ML IJ SUSP
40.0000 mg | Freq: Once | INTRAMUSCULAR | Status: AC
  Administered 2016-03-14: 40 mg via INTRAMUSCULAR

## 2016-03-14 MED ORDER — PREDNISONE 20 MG PO TABS: ORAL_TABLET | ORAL | 0 refills | Status: DC

## 2016-03-14 NOTE — Addendum Note (Signed)
Addended by: Shary Decamp B on: 03/14/2016 04:49 PM   Modules accepted: Orders

## 2016-03-14 NOTE — Progress Notes (Signed)
   Subjective:    Patient ID: Alexa Allen, female    DOB: [birth year], 60 y.o.   MRN: [id number]  HPI  Patient has a one-week history of poison ivy on her left leg. She has numerous erythematous papules and bumps several coalescent into scratches and linear groupings on her left calf on the dorsum of her left foot on the medial aspect of her left thigh. There are intensely pruritic. She is tried cortisone cream with minimal relief. She is requesting a steroid shot Past Medical History:  Diagnosis Date  . Elevated liver enzymes   . HSV (herpes simplex virus) infection   . Liver cyst    History reviewed. No pertinent surgical history. Current Outpatient Prescriptions on File Prior to Visit  Medication Sig Dispense Refill  . valACYclovir (VALTREX) 500 MG tablet TAKE 1 TABLET DAILY 90 tablet 3   No current facility-administered medications on file prior to visit.    Allergies  Allergen Reactions  . Iohexol      Code: HIVES, Desc: hives with IV cm- tolerated 1 hour quick prep well, Onset Date: [id number]    Social History   Social History  . Marital status: Married    Spouse name: N/A  . Number of children: N/A  . Years of education: N/A   Occupational History  . Not on file.   Social History Main Topics  . Smoking status: Former Research scientist (life sciences)  . Smokeless tobacco: Never Used  . Alcohol use Yes     Comment: Occasional  . Drug use: No  . Sexual activity: Yes   Other Topics Concern  . Not on file   Social History Narrative  . No narrative on file     Review of Systems  All other systems reviewed and are negative.      Objective:   Physical Exam  Cardiovascular: Normal rate, regular rhythm and normal heart sounds.   Pulmonary/Chest: Effort normal and breath sounds normal.  Skin: Rash noted. There is erythema.  Vitals reviewed.         Assessment & Plan:  Contact dermatitis - Plan: predniSONE (DELTASONE) 20 MG tablet  Begin prednisone taper pack in the  morning. Depo-Medrol 60 mg IM 1 now. Recheck in one week if her current or immediately if worsening

## 2016-05-07 ENCOUNTER — Encounter: Payer: BLUE CROSS/BLUE SHIELD | Admitting: Family Medicine

## 2016-05-16 ENCOUNTER — Encounter: Payer: Self-pay | Admitting: Family Medicine

## 2016-05-16 ENCOUNTER — Encounter (INDEPENDENT_AMBULATORY_CARE_PROVIDER_SITE_OTHER): Payer: Self-pay

## 2016-05-16 ENCOUNTER — Ambulatory Visit (INDEPENDENT_AMBULATORY_CARE_PROVIDER_SITE_OTHER): Payer: BLUE CROSS/BLUE SHIELD | Admitting: Family Medicine

## 2016-05-16 VITALS — BP 126/72 | HR 76 | Temp 98.0°F | Resp 16 | Ht 62.0 in | Wt 153.0 lb

## 2016-05-16 DIAGNOSIS — Z23 Encounter for immunization: Secondary | ICD-10-CM | POA: Diagnosis not present

## 2016-05-16 DIAGNOSIS — Z0000 Encounter for general adult medical examination without abnormal findings: Secondary | ICD-10-CM

## 2016-05-16 NOTE — Progress Notes (Signed)
Subjective:    Patient ID: Alexa Allen, female    DOB: May 14, 1956, 60 y.o.   MRN: [id number]  HPI Here for CPE.  Last colonoscopy was 2009.  Polyp was hyperplastic.  Not due again until 2019.  Mammogram and pap smear will be performed at her GYN office 05/25/16.  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 Her total cholesterol was 213 however her HDL cholesterol was 65. Her non-HDL cholesterol was then less than 160 which would be the goal for this patient. She does not smoke. She is not yet due for bone density. Tetanus shot was given to her 2 years ago. She received her flu shot yesterday at work. She is due for a shingles vaccine and would like to obtain that today. Her fasting blood sugar was also checked at her office and it was 88. She is due for a CMP as well as a CBC. Otherwise she is doing well with no concerns. Past Medical History:  Diagnosis Date  . Elevated liver enzymes   . HSV (herpes simplex virus) infection   . Liver cyst    No past surgical history on file. Current Outpatient Prescriptions on File Prior to Visit  Medication Sig Dispense Refill  . valACYclovir (VALTREX) 500 MG tablet TAKE 1 TABLET DAILY 90 tablet 3   No current facility-administered medications on file prior to visit.    Allergies  Allergen Reactions  . Iohexol      Code: HIVES, Desc: hives with IV cm- tolerated 1 hour quick prep well, Onset Date: [id number]    Social History   Social History  . Marital status: Married    Spouse name: N/A  . Number of children: N/A  . Years of education: N/A   Occupational History  . Not on file.   Social History Main Topics  . Smoking status: Former Research scientist (life sciences)  . Smokeless tobacco: Never Used  . Alcohol use Yes     Comment: Occasional  . Drug use: No  . Sexual activity: Yes   Other Topics Concern  . Not on file   Social History Narrative  . No narrative on file     Review of Systems  All other systems  reviewed and are negative.      Objective:   Physical Exam  Constitutional: She is oriented to person, place, and time. She appears well-developed and well-nourished. No distress.  HENT:  Head: Normocephalic and atraumatic.  Right Ear: External ear normal.  Left Ear: External ear normal.  Nose: Nose normal.  Mouth/Throat: Oropharynx is clear and moist. No oropharyngeal exudate.  Eyes: Conjunctivae and EOM are normal. Pupils are equal, round, and reactive to light. Right eye exhibits no discharge. Left eye exhibits no discharge. No scleral icterus.  Neck: Normal range of motion. Neck supple. No JVD present. No tracheal deviation present. No thyromegaly present.  Cardiovascular: Normal rate, regular rhythm, normal heart sounds and intact distal pulses.  Exam reveals no gallop and no friction rub.   No murmur heard. Pulmonary/Chest: Effort normal and breath sounds normal. No stridor. No respiratory distress. She has no wheezes. She has no rales. She exhibits no tenderness.  Abdominal: Soft. Bowel sounds are normal. She exhibits no distension and no mass. There is no tenderness. There is no rebound and no guarding.  Musculoskeletal: Normal range of motion. She exhibits no edema or tenderness.  Lymphadenopathy:    She has no cervical adenopathy.  Neurological: She is alert  and oriented to person, place, and time. She has normal reflexes. No cranial nerve deficit. She exhibits normal muscle tone. Coordination normal.  Skin: Skin is warm. No rash noted. She is not diaphoretic. No erythema. No pallor.  Psychiatric: She has a normal mood and affect. Her behavior is normal. Judgment and thought content normal.  Vitals reviewed.         Assessment & Plan:  Routine general medical examination at a health care facility   physical exam is normal. Cancer screening is up-to-date. I will check a CBC, CMP.  Her lipids and fasting blood sugar were outstanding. Patient received a shingles vaccine.  Her other vaccines are up-to-date. Regular anticipatory guidance is provided. She sees her gynecologist for her mammogram and her Pap smear. Bone density test at age 23 is recommended. Hepatitis C screening his only been performed. Patient declines HIV screening.

## 2016-05-17 LAB — CBC WITH DIFFERENTIAL/PLATELET
Basophils Absolute: 48 cells/uL (ref 0–200)
Basophils Relative: 1 %
Eosinophils Absolute: 144 cells/uL (ref 15–500)
Eosinophils Relative: 3 %
HEMATOCRIT: 39.4 % (ref 35.0–45.0)
Hemoglobin: 12.9 g/dL (ref 12.0–15.0)
LYMPHS PCT: 35 %
Lymphs Abs: 1680 cells/uL (ref 850–3900)
MCH: 29.6 pg (ref 27.0–33.0)
MCHC: 32.7 g/dL (ref 32.0–36.0)
MCV: 90.4 fL (ref 80.0–100.0)
MONO ABS: 528 {cells}/uL (ref 200–950)
MONOS PCT: 11 %
MPV: 11.5 fL (ref 7.5–12.5)
NEUTROS PCT: 50 %
Neutro Abs: 2400 cells/uL (ref 1500–7800)
PLATELETS: 207 10*3/uL (ref 140–400)
RBC: 4.36 MIL/uL (ref 3.80–5.10)
RDW: 14.3 % (ref 11.0–15.0)
WBC: 4.8 10*3/uL (ref 3.8–10.8)

## 2016-05-17 LAB — COMPLETE METABOLIC PANEL WITH GFR
ALT: 51 U/L — ABNORMAL HIGH (ref 6–29)
AST: 35 U/L (ref 10–35)
Albumin: 4.3 g/dL (ref 3.6–5.1)
Alkaline Phosphatase: 84 U/L (ref 33–130)
BUN: 13 mg/dL (ref 7–25)
CALCIUM: 9.3 mg/dL (ref 8.6–10.4)
CHLORIDE: 103 mmol/L (ref 98–110)
CO2: 28 mmol/L (ref 20–31)
Creat: 0.69 mg/dL (ref 0.50–0.99)
GFR, Est Non African American: >89 mL/min (ref >=60)
Glucose, Bld: 95 mg/dL (ref 70–99)
POTASSIUM: 4.1 mmol/L (ref 3.5–5.3)
Sodium: 140 mmol/L (ref 135–146)
Total Bilirubin: 0.3 mg/dL (ref 0.2–1.2)
Total Protein: 6.7 g/dL (ref 6.1–8.1)

## 2016-05-17 NOTE — Addendum Note (Signed)
Addended by: Shary Decamp B on: 05/17/2016 10:22 AM   Modules accepted: Orders

## 2016-05-24 DIAGNOSIS — Z1231 Encounter for screening mammogram for malignant neoplasm of breast: Secondary | ICD-10-CM | POA: Diagnosis not present

## 2016-06-12 ENCOUNTER — Encounter: Payer: Self-pay | Admitting: Family Medicine

## 2016-08-06 DIAGNOSIS — Z01419 Encounter for gynecological examination (general) (routine) without abnormal findings: Secondary | ICD-10-CM | POA: Diagnosis not present

## 2016-09-23 DIAGNOSIS — R102 Pelvic and perineal pain: Secondary | ICD-10-CM | POA: Diagnosis not present

## 2016-09-23 DIAGNOSIS — Z1389 Encounter for screening for other disorder: Secondary | ICD-10-CM | POA: Diagnosis not present

## 2016-09-27 ENCOUNTER — Encounter: Payer: Self-pay | Admitting: Family Medicine

## 2016-09-27 ENCOUNTER — Ambulatory Visit (INDEPENDENT_AMBULATORY_CARE_PROVIDER_SITE_OTHER): Payer: BLUE CROSS/BLUE SHIELD | Admitting: Family Medicine

## 2016-09-27 VITALS — BP 132/80 | HR 74 | Temp 97.9°F | Resp 18 | Ht 62.0 in | Wt 155.0 lb

## 2016-09-27 DIAGNOSIS — R102 Pelvic and perineal pain: Secondary | ICD-10-CM | POA: Diagnosis not present

## 2016-09-27 DIAGNOSIS — R1032 Left lower quadrant pain: Secondary | ICD-10-CM | POA: Diagnosis not present

## 2016-09-27 NOTE — Progress Notes (Signed)
   Subjective:    Patient ID: Alexa Allen, female    DOB: 26-Feb-1956, 61 y.o.   MRN: [id number]  HPI Patient is a very pleasant 61 year old white female who developed sudden onset of left lower quadrant abdominal pain beginning this weekend. Pain persisted all weekend and was severe. Was seen at her gynecologist office on Monday. Was diagnosed with diverticulitis and was started on doxycycline 100 mg by mouth twice a day for 10 days. Patient has been afebrile. After 48 hours the pain went away. Today she is pain-free. She denies any blood in her stool. She denies any black tarry stool. She denies any nausea or vomiting. She denies any diarrhea or constipation. She denies any hematuria. Her gynecologist, Dr. Ulanda Edison, also performed an ultrasound which was reportedly normal as well as a urinalysis which was reportedly normal. Overall today the patient feels great. She states that the pain was definitely in the left lower quadrant and her last colonoscopy was 9 years ago at that time there was no evidence of diverticulosis  Past Medical History:  Diagnosis Date  . Elevated liver enzymes   . HSV (herpes simplex virus) infection   . Liver cyst    No past surgical history on file. Current Outpatient Prescriptions on File Prior to Visit  Medication Sig Dispense Refill  . valACYclovir (VALTREX) 500 MG tablet TAKE 1 TABLET DAILY 90 tablet 3   No current facility-administered medications on file prior to visit.    Allergies  Allergen Reactions  . Iohexol      Code: HIVES, Desc: hives with IV cm- tolerated 1 hour quick prep well, Onset Date: [id number]    Social History   Social History  . Marital status: Married    Spouse name: N/A  . Number of children: N/A  . Years of education: N/A   Occupational History  . Not on file.   Social History Main Topics  . Smoking status: Former Research scientist (life sciences)  . Smokeless tobacco: Never Used  . Alcohol use Yes     Comment: Occasional  . Drug use: No  .  Sexual activity: Yes   Other Topics Concern  . Not on file   Social History Narrative  . No narrative on file      Review of Systems  All other systems reviewed and are negative.      Objective:   Physical Exam  Cardiovascular: Normal rate, regular rhythm and normal heart sounds.   Pulmonary/Chest: Effort normal and breath sounds normal. No respiratory distress. She has no wheezes. She has no rales.  Abdominal: Soft. Bowel sounds are normal. She exhibits no distension. There is no tenderness. There is no rebound and no guarding.  Vitals reviewed.         Assessment & Plan:  Left lower quadrant abdominal pain likely diverticulitis. Typically I treat with Cipro and Flagyl for 10 days however the patient has clinically improved and is now asymptomatic. Therefore I can see no medical justification for switching her antibiotic at this time. I've asked the patient complete the course of doxycycline. Should her pain return, I will switch the patient Cipro 500 mg by mouth twice a day and Flagyl 500 mg by mouth twice a day for a total of 10 days each. If the pain worsens, I will proceed with a CT scan of the abdomen and pelvis although her history and location to support diverticulitis even in the absence of diverticulosis on colonoscopy was performed 9 years ago

## 2017-01-21 ENCOUNTER — Other Ambulatory Visit: Payer: Self-pay | Admitting: Family Medicine

## 2017-02-25 ENCOUNTER — Ambulatory Visit (INDEPENDENT_AMBULATORY_CARE_PROVIDER_SITE_OTHER): Payer: BLUE CROSS/BLUE SHIELD | Admitting: Family Medicine

## 2017-02-25 ENCOUNTER — Encounter: Payer: Self-pay | Admitting: Family Medicine

## 2017-02-25 VITALS — BP 120/78 | HR 78 | Temp 98.5°F | Resp 16 | Ht 62.0 in | Wt 154.0 lb

## 2017-02-25 DIAGNOSIS — D485 Neoplasm of uncertain behavior of skin: Secondary | ICD-10-CM

## 2017-02-25 DIAGNOSIS — L918 Other hypertrophic disorders of the skin: Secondary | ICD-10-CM | POA: Diagnosis not present

## 2017-02-25 DIAGNOSIS — L570 Actinic keratosis: Secondary | ICD-10-CM | POA: Diagnosis not present

## 2017-02-25 NOTE — Progress Notes (Signed)
   Subjective:    Patient ID: Alexa Allen, female    DOB: 1955/10/13, 61 y.o.   MRN: [id number]  HPI Patient has a mole that is changing in size in color on the lateral edge of the right bicep. It is approximately 5 mm in diameter. It is watery and light brown in nature. It is scaly. I believe it's a seborrheic keratosis.  However patient is requesting a biopsy today just to be sure because she is concerned it may be melanoma. She also has a small 2-3 mm skin tag on the posterior aspect of her left axilla that she would like treated with cryotherapy. Past Medical History:  Diagnosis Date  . Elevated liver enzymes   . HSV (herpes simplex virus) infection   . Liver cyst    No past surgical history on file. Current Outpatient Prescriptions on File Prior to Visit  Medication Sig Dispense Refill  . valACYclovir (VALTREX) 500 MG tablet TAKE 1 TABLET DAILY 90 tablet 1   No current facility-administered medications on file prior to visit.    Allergies  Allergen Reactions  . Iohexol      Code: HIVES, Desc: hives with IV cm- tolerated 1 hour quick prep well, Onset Date: [id number]    Social History   Social History  . Marital status: Married    Spouse name: N/A  . Number of children: N/A  . Years of education: N/A   Occupational History  . Not on file.   Social History Main Topics  . Smoking status: Former Research scientist (life sciences)  . Smokeless tobacco: Never Used  . Alcohol use Yes     Comment: Occasional  . Drug use: No  . Sexual activity: Yes   Other Topics Concern  . Not on file   Social History Narrative  . No narrative on file      Review of Systems  All other systems reviewed and are negative.      Objective:   Physical Exam  Cardiovascular: Normal rate, regular rhythm and normal heart sounds.   Pulmonary/Chest: Effort normal and breath sounds normal.  Vitals reviewed. see hpi 2 lesions         Assessment & Plan:  Neoplasm of uncertain behavior of skin - Plan:  Pathology  Skin tag  Lesion on her right bicep was anesthetized 0.1% lidocaine with epinephrine. A shave biopsy was performed and sent to pathology in a labeled container. Patient tolerated procedure well without complication. Hemostasis was achieved with Drysol and a Band-Aid. Await pathology results. Lesion in the left axilla was then treated with liquid nitrogen cryotherapy for a total of 30 seconds

## 2017-02-27 LAB — PATHOLOGY

## 2017-03-17 ENCOUNTER — Ambulatory Visit (INDEPENDENT_AMBULATORY_CARE_PROVIDER_SITE_OTHER): Payer: BLUE CROSS/BLUE SHIELD | Admitting: Family Medicine

## 2017-03-17 ENCOUNTER — Encounter: Payer: Self-pay | Admitting: Family Medicine

## 2017-03-17 VITALS — BP 108/68 | HR 76 | Temp 98.4°F | Resp 16 | Ht 62.0 in | Wt 155.0 lb

## 2017-03-17 DIAGNOSIS — L237 Allergic contact dermatitis due to plants, except food: Secondary | ICD-10-CM

## 2017-03-17 MED ORDER — METHYLPREDNISOLONE ACETATE 40 MG/ML IJ SUSP
60.0000 mg | Freq: Once | INTRAMUSCULAR | Status: AC
  Administered 2017-03-17: 60 mg via INTRAMUSCULAR

## 2017-03-17 MED ORDER — PREDNISONE 20 MG PO TABS: ORAL_TABLET | ORAL | 0 refills | Status: DC

## 2017-03-17 NOTE — Progress Notes (Signed)
   Subjective:    Patient ID: Alexa Allen, female    DOB: 1956-06-15, 61 y.o.   MRN: [id number]  HPI Patient has poison oak all over her arms. Has been there for more than a week. It is intensely pruritic.  She has tried topical calamine and cortisone creams without relief. She is requesting a "shot". Past Medical History:  Diagnosis Date  . Elevated liver enzymes   . HSV (herpes simplex virus) infection   . Liver cyst    No past surgical history on file. Current Outpatient Prescriptions on File Prior to Visit  Medication Sig Dispense Refill  . valACYclovir (VALTREX) 500 MG tablet TAKE 1 TABLET DAILY 90 tablet 1   No current facility-administered medications on file prior to visit.    Allergies  Allergen Reactions  . Iohexol      Code: HIVES, Desc: hives with IV cm- tolerated 1 hour quick prep well, Onset Date: [id number]   . Oxycodone-Acetaminophen    Social History   Social History  . Marital status: Married    Spouse name: N/A  . Number of children: N/A  . Years of education: N/A   Occupational History  . Not on file.   Social History Main Topics  . Smoking status: Former Research scientist (life sciences)  . Smokeless tobacco: Never Used  . Alcohol use Yes     Comment: Occasional  . Drug use: No  . Sexual activity: Yes   Other Topics Concern  . Not on file   Social History Narrative  . No narrative on file      Review of Systems  All other systems reviewed and are negative.      Objective:   Physical Exam  Cardiovascular: Normal rate, regular rhythm and normal heart sounds.   Pulmonary/Chest: Effort normal and breath sounds normal.  Skin: Rash noted. There is erythema.  Vitals reviewed.          Assessment & Plan:  Patient has contact dermatitis on both arms secondary to poison oak. Recommended topical cortisone cream, the patient requests that I give her a cortisone shot and some prednisone pills. The itching is intense. We discussed the risk of glucocorticoids,  and the patient accepts the risk. Therefore I'll give her 60 mg of Depo-Medrol and she can begin a prednisone taper pack tomorrow

## 2017-03-17 NOTE — Addendum Note (Signed)
Addended by: Shary Decamp B on: 03/17/2017 11:17 AM   Modules accepted: Orders

## 2017-05-23 ENCOUNTER — Ambulatory Visit (INDEPENDENT_AMBULATORY_CARE_PROVIDER_SITE_OTHER): Payer: BLUE CROSS/BLUE SHIELD | Admitting: Family Medicine

## 2017-05-23 ENCOUNTER — Encounter: Payer: Self-pay | Admitting: Family Medicine

## 2017-05-23 VITALS — BP 108/76 | HR 74 | Temp 97.8°F | Resp 14 | Ht 61.0 in | Wt 153.0 lb

## 2017-05-23 DIAGNOSIS — Z0000 Encounter for general adult medical examination without abnormal findings: Secondary | ICD-10-CM

## 2017-05-23 LAB — COMPLETE METABOLIC PANEL WITH GFR
AG Ratio: 1.7 (calc) (ref 1.0–2.5)
ALBUMIN MSPROF: 4.2 g/dL (ref 3.6–5.1)
ALT: 35 U/L — ABNORMAL HIGH (ref 6–29)
AST: 29 U/L (ref 10–35)
Alkaline phosphatase (APISO): 85 U/L (ref 33–130)
BUN: 14 mg/dL (ref 7–25)
CALCIUM: 9.2 mg/dL (ref 8.6–10.4)
CO2: 25 mmol/L (ref 20–32)
CREATININE: 0.69 mg/dL (ref 0.50–0.99)
Chloride: 105 mmol/L (ref 98–110)
GFR, EST AFRICAN AMERICAN: 109 mL/min/{1.73_m2} (ref >=60)
GFR, EST NON AFRICAN AMERICAN: 94 mL/min/{1.73_m2} (ref >=60)
GLOBULIN: 2.5 g/dL (ref 1.9–3.7)
Glucose, Bld: 90 mg/dL (ref 65–99)
Potassium: 4.2 mmol/L (ref 3.5–5.3)
SODIUM: 139 mmol/L (ref 135–146)
TOTAL PROTEIN: 6.7 g/dL (ref 6.1–8.1)
Total Bilirubin: 0.5 mg/dL (ref 0.2–1.2)

## 2017-05-23 LAB — CBC WITH DIFFERENTIAL/PLATELET
BASOS ABS: 40 {cells}/uL (ref 0–200)
Basophils Relative: 0.9 %
EOS PCT: 3 %
Eosinophils Absolute: 132 cells/uL (ref 15–500)
HEMATOCRIT: 41.2 % (ref 35.0–45.0)
HEMOGLOBIN: 13.8 g/dL (ref 11.7–15.5)
LYMPHS ABS: 1659 {cells}/uL (ref 850–3900)
MCH: 29.9 pg (ref 27.0–33.0)
MCHC: 33.5 g/dL (ref 32.0–36.0)
MCV: 89.2 fL (ref 80.0–100.0)
MPV: 11.9 fL (ref 7.5–12.5)
Monocytes Relative: 7 %
NEUTROS ABS: 2262 {cells}/uL (ref 1500–7800)
Neutrophils Relative %: 51.4 %
Platelets: 199 10*3/uL (ref 140–400)
RBC: 4.62 10*6/uL (ref 3.80–5.10)
RDW: 12.9 % (ref 11.0–15.0)
Total Lymphocyte: 37.7 %
WBC mixed population: 308 cells/uL (ref 200–950)
WBC: 4.4 10*3/uL (ref 3.8–10.8)

## 2017-05-23 NOTE — Progress Notes (Signed)
Subjective:    Patient ID: Alexa Allen, female    DOB: [birth year], 61 y.o.   MRN: [id number]  HPI Patient is a very pleasant 61 y/o WF here for CPE.  Last colonoscopy was 2009.  Polyp was hyperplastic.  Not due again until 2019.  Mammogram and pap smear will be performed at her GYN office (Dr. Ulanda Edison) later this year which she prefers to schedule.  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 Her total cholesterol was 213 however her HDL cholesterol was 60. Her non-HDL cholesterol was then 153 which is less than 160 which would be the goal for this patient. She does not smoke. She is not yet due for bone density. Tetanus shot is up to date. She received her flu shot at work. She had zostavax in 2017.  Due for shingrix but deferred at present.  She has no other concerns.  Past Medical History:  Diagnosis Date  . Elevated liver enzymes   . HSV (herpes simplex virus) infection   . Liver cyst    No past surgical history on file. Current Outpatient Prescriptions on File Prior to Visit  Medication Sig Dispense Refill  . valACYclovir (VALTREX) 500 MG tablet TAKE 1 TABLET DAILY 90 tablet 1   No current facility-administered medications on file prior to visit.    Allergies  Allergen Reactions  . Iohexol      Code: HIVES, Desc: hives with IV cm- tolerated 1 hour quick prep well, Onset Date: [id number]   . Oxycodone-Acetaminophen    Social History   Social History  . Marital status: Married    Spouse name: N/A  . Number of children: N/A  . Years of education: N/A   Occupational History  . Not on file.   Social History Main Topics  . Smoking status: Former Research scientist (life sciences)  . Smokeless tobacco: Never Used  . Alcohol use Yes     Comment: Occasional  . Drug use: No  . Sexual activity: Yes   Other Topics Concern  . Not on file   Social History Narrative  . No narrative on file     Review of Systems  All other systems reviewed and are  negative.      Objective:   Physical Exam  Constitutional: She is oriented to person, place, and time. She appears well-developed and well-nourished. No distress.  HENT:  Head: Normocephalic and atraumatic.  Right Ear: External ear normal.  Left Ear: External ear normal.  Nose: Nose normal.  Mouth/Throat: Oropharynx is clear and moist. No oropharyngeal exudate.  Eyes: Pupils are equal, round, and reactive to light. Conjunctivae and EOM are normal. Right eye exhibits no discharge. Left eye exhibits no discharge. No scleral icterus.  Neck: Normal range of motion. Neck supple. No JVD present. No tracheal deviation present. No thyromegaly present.  Cardiovascular: Normal rate, regular rhythm, normal heart sounds and intact distal pulses.  Exam reveals no gallop and no friction rub.   No murmur heard. Pulmonary/Chest: Effort normal and breath sounds normal. No stridor. No respiratory distress. She has no wheezes. She has no rales. She exhibits no tenderness.  Abdominal: Soft. Bowel sounds are normal. She exhibits no distension and no mass. There is no tenderness. There is no rebound and no guarding.  Musculoskeletal: Normal range of motion. She exhibits no edema or tenderness.  Lymphadenopathy:    She has no cervical adenopathy.  Neurological: She is alert and oriented to person, place, and time.  She has normal reflexes. No cranial nerve deficit. She exhibits normal muscle tone. Coordination normal.  Skin: Skin is warm. No rash noted. She is not diaphoretic. No erythema. No pallor.  Psychiatric: She has a normal mood and affect. Her behavior is normal. Judgment and thought content normal.  Vitals reviewed.         Assessment & Plan:  General medical exam - Plan: CBC with Differential/Platelet, COMPLETE METABOLIC PANEL WITH GFR, CANCELED: Lipid panel, CANCELED: Hepatitis C Antibody   Physical exam is normal. Cancer screening is up-to-date. I will check a CBC, CMP.  Her lipids and  fasting blood sugar were outstanding. Her vaccines are up-to-date. We did discuss shingrix but deferred for now.  Regular anticipatory guidance is provided. She sees her gynecologist for her mammogram and her Pap smear. Bone density test at age 48 is recommended. Hepatitis C screening has been performed. Patient declines HIV screening.

## 2017-08-10 ENCOUNTER — Other Ambulatory Visit: Payer: Self-pay | Admitting: Family Medicine

## 2017-08-15 DIAGNOSIS — Z130 Encounter for screening for diseases of the blood and blood-forming organs and certain disorders involving the immune mechanism: Secondary | ICD-10-CM | POA: Diagnosis not present

## 2017-08-15 DIAGNOSIS — N3941 Urge incontinence: Secondary | ICD-10-CM | POA: Diagnosis not present

## 2017-08-15 DIAGNOSIS — Z1389 Encounter for screening for other disorder: Secondary | ICD-10-CM | POA: Diagnosis not present

## 2017-08-15 DIAGNOSIS — Z1231 Encounter for screening mammogram for malignant neoplasm of breast: Secondary | ICD-10-CM | POA: Diagnosis not present

## 2017-08-15 DIAGNOSIS — Z6829 Body mass index (BMI) 29.0-29.9, adult: Secondary | ICD-10-CM | POA: Diagnosis not present

## 2017-08-15 DIAGNOSIS — Z01419 Encounter for gynecological examination (general) (routine) without abnormal findings: Secondary | ICD-10-CM | POA: Diagnosis not present

## 2017-09-19 DIAGNOSIS — Z01419 Encounter for gynecological examination (general) (routine) without abnormal findings: Secondary | ICD-10-CM | POA: Diagnosis not present

## 2017-11-10 DIAGNOSIS — J111 Influenza due to unidentified influenza virus with other respiratory manifestations: Secondary | ICD-10-CM | POA: Diagnosis not present

## 2017-11-11 ENCOUNTER — Ambulatory Visit: Payer: BLUE CROSS/BLUE SHIELD | Admitting: Family Medicine

## 2018-01-07 ENCOUNTER — Encounter: Payer: Self-pay | Admitting: Internal Medicine

## 2018-02-11 ENCOUNTER — Other Ambulatory Visit: Payer: Self-pay | Admitting: Family Medicine

## 2018-04-10 DIAGNOSIS — L821 Other seborrheic keratosis: Secondary | ICD-10-CM | POA: Diagnosis not present

## 2018-04-10 DIAGNOSIS — L814 Other melanin hyperpigmentation: Secondary | ICD-10-CM | POA: Diagnosis not present

## 2018-04-10 DIAGNOSIS — L820 Inflamed seborrheic keratosis: Secondary | ICD-10-CM | POA: Diagnosis not present

## 2018-04-10 DIAGNOSIS — L570 Actinic keratosis: Secondary | ICD-10-CM | POA: Diagnosis not present

## 2018-04-10 DIAGNOSIS — D1801 Hemangioma of skin and subcutaneous tissue: Secondary | ICD-10-CM | POA: Diagnosis not present

## 2018-04-10 DIAGNOSIS — D225 Melanocytic nevi of trunk: Secondary | ICD-10-CM | POA: Diagnosis not present

## 2018-04-24 ENCOUNTER — Encounter: Payer: Self-pay | Admitting: Gastroenterology

## 2018-05-29 ENCOUNTER — Encounter: Payer: Self-pay | Admitting: Family Medicine

## 2018-05-29 ENCOUNTER — Ambulatory Visit (INDEPENDENT_AMBULATORY_CARE_PROVIDER_SITE_OTHER): Payer: BLUE CROSS/BLUE SHIELD | Admitting: Family Medicine

## 2018-05-29 VITALS — BP 132/78 | HR 82 | Temp 98.1°F | Resp 16 | Ht 62.0 in | Wt 156.0 lb

## 2018-05-29 DIAGNOSIS — Z0000 Encounter for general adult medical examination without abnormal findings: Secondary | ICD-10-CM

## 2018-05-29 LAB — CBC WITH DIFFERENTIAL/PLATELET
BASOS ABS: 60 {cells}/uL (ref 0–200)
BASOS PCT: 1.2 %
Eosinophils Absolute: 110 cells/uL (ref 15–500)
Eosinophils Relative: 2.2 %
HEMATOCRIT: 43.4 % (ref 35.0–45.0)
HEMOGLOBIN: 14.4 g/dL (ref 11.7–15.5)
LYMPHS ABS: 1635 {cells}/uL (ref 850–3900)
MCH: 29.7 pg (ref 27.0–33.0)
MCHC: 33.2 g/dL (ref 32.0–36.0)
MCV: 89.5 fL (ref 80.0–100.0)
MONOS PCT: 9.7 %
MPV: 11.9 fL (ref 7.5–12.5)
NEUTROS ABS: 2710 {cells}/uL (ref 1500–7800)
Neutrophils Relative %: 54.2 %
Platelets: 218 10*3/uL (ref 140–400)
RBC: 4.85 10*6/uL (ref 3.80–5.10)
RDW: 12.5 % (ref 11.0–15.0)
Total Lymphocyte: 32.7 %
WBC mixed population: 485 cells/uL (ref 200–950)
WBC: 5 10*3/uL (ref 3.8–10.8)

## 2018-05-29 LAB — COMPLETE METABOLIC PANEL WITH GFR
AG RATIO: 1.7 (calc) (ref 1.0–2.5)
ALT: 36 U/L — AB (ref 6–29)
AST: 34 U/L (ref 10–35)
Albumin: 4.3 g/dL (ref 3.6–5.1)
Alkaline phosphatase (APISO): 96 U/L (ref 33–130)
BUN: 16 mg/dL (ref 7–25)
CALCIUM: 9.5 mg/dL (ref 8.6–10.4)
CO2: 27 mmol/L (ref 20–32)
CREATININE: 0.67 mg/dL (ref 0.50–0.99)
Chloride: 104 mmol/L (ref 98–110)
GFR, EST AFRICAN AMERICAN: 109 mL/min/{1.73_m2} (ref >=60)
GFR, EST NON AFRICAN AMERICAN: 94 mL/min/{1.73_m2} (ref >=60)
Globulin: 2.6 g/dL (calc) (ref 1.9–3.7)
Glucose, Bld: 95 mg/dL (ref 65–99)
POTASSIUM: 4.4 mmol/L (ref 3.5–5.3)
Sodium: 138 mmol/L (ref 135–146)
TOTAL PROTEIN: 6.9 g/dL (ref 6.1–8.1)
Total Bilirubin: 0.5 mg/dL (ref 0.2–1.2)

## 2018-05-29 LAB — LIPID PANEL
Cholesterol: 237 mg/dL — ABNORMAL HIGH (ref <200)
HDL: 57 mg/dL (ref >50)
LDL CHOLESTEROL (CALC): 158 mg/dL — AB
NON-HDL CHOLESTEROL (CALC): 180 mg/dL — AB (ref <130)
TRIGLYCERIDES: 107 mg/dL (ref <150)
Total CHOL/HDL Ratio: 4.2 (calc) (ref <5.0)

## 2018-05-29 MED ORDER — DICLOFENAC SODIUM 1 % TD GEL: 2.0000 g | Freq: Four times a day (QID) | TRANSDERMAL | 3 refills | Status: DC

## 2018-05-29 NOTE — Progress Notes (Signed)
Subjective:    Patient ID: Alexa Allen, female    DOB: September 14, 1955, 62 y.o.   MRN: [id number]  HPI Patient is a very pleasant 62 y/o WF here for CPE.  Last colonoscopy was 2009.  Polyp was hyperplastic.  She is due this year and already has an appointment scheduled for November.  Mammogram and pap smear were performed performed at her GYN office (Dr. Ulanda Edison) in January the patient believes.  She consents to have US obtain records from his office.  She reports pain at the DIP joints bilaterally.  It is worse on her right hand particularly the third and fourth fingers.  She reports stiffness in the morning that improves after several minutes.  She also reports pain and difficulty closing her fist completely.  Immunization records are below: Immunization History  Administered Date(s) Administered  . Influenza,inj,Quad PF,6+ Mos 05/18/2014, 05/15/2016  . Influenza-Unspecified 05/22/2018  . Zoster 05/16/2016    Past Medical History:  Diagnosis Date  . Elevated liver enzymes   . HSV (herpes simplex virus) infection   . Liver cyst    No past surgical history on file. Current Outpatient Medications on File Prior to Visit  Medication Sig Dispense Refill  . valACYclovir (VALTREX) 500 MG tablet TAKE 1 TABLET DAILY 90 tablet 1   No current facility-administered medications on file prior to visit.    Allergies  Allergen Reactions  . Iohexol      Code: HIVES, Desc: hives with IV cm- tolerated 1 hour quick prep well, Onset Date: [id number]   . Oxycodone-Acetaminophen    Social History   Socioeconomic History  . Marital status: Married    Spouse name: Not on file  . Number of children: Not on file  . Years of education: Not on file  . Highest education level: Not on file  Occupational History  . Not on file  Social Needs  . Financial resource strain: Not on file  . Food insecurity:    Worry: Not on file    Inability: Not on file  . Transportation needs:    Medical: Not on file    Non-medical: Not on file  Tobacco Use  . Smoking status: Former Research scientist (life sciences)  . Smokeless tobacco: Never Used  Substance and Sexual Activity  . Alcohol use: Yes    Comment: Occasional  . Drug use: No  . Sexual activity: Yes  Lifestyle  . Physical activity:    Days per week: Not on file    Minutes per session: Not on file  . Stress: Not on file  Relationships  . Social connections:    Talks on phone: Not on file    Gets together: Not on file    Attends religious service: Not on file    Active member of club or organization: Not on file    Attends meetings of clubs or organizations: Not on file    Relationship status: Not on file  . Intimate partner violence:    Fear of current or ex partner: Not on file    Emotionally abused: Not on file    Physically abused: Not on file    Forced sexual activity: Not on file  Other Topics Concern  . Not on file  Social History Narrative  . Not on file     Review of Systems  All other systems reviewed and are negative.      Objective:   Physical Exam  Constitutional: She is oriented to person, place, and time. She appears  well-developed and well-nourished. No distress.  HENT:  Head: Normocephalic and atraumatic.  Right Ear: External ear normal.  Left Ear: External ear normal.  Nose: Nose normal.  Mouth/Throat: Oropharynx is clear and moist. No oropharyngeal exudate.  Eyes: Pupils are equal, round, and reactive to light. Conjunctivae and EOM are normal. Right eye exhibits no discharge. Left eye exhibits no discharge. No scleral icterus.  Neck: Normal range of motion. Neck supple. No JVD present. No tracheal deviation present. No thyromegaly present.  Cardiovascular: Normal rate, regular rhythm, normal heart sounds and intact distal pulses. Exam reveals no gallop and no friction rub.  No murmur heard. Pulmonary/Chest: Effort normal and breath sounds normal. No stridor. No respiratory distress. She has no wheezes. She has no rales. She  exhibits no tenderness.  Abdominal: Soft. Bowel sounds are normal. She exhibits no distension and no mass. There is no tenderness. There is no rebound and no guarding.  Musculoskeletal: Normal range of motion. She exhibits no edema or tenderness.  Lymphadenopathy:    She has no cervical adenopathy.  Neurological: She is alert and oriented to person, place, and time. She has normal reflexes. No cranial nerve deficit. She exhibits normal muscle tone. Coordination normal.  Skin: Skin is warm. No rash noted. She is not diaphoretic. No erythema. No pallor.  Psychiatric: She has a normal mood and affect. Her behavior is normal. Judgment and thought content normal.  Vitals reviewed.         Assessment & Plan:  General medical exam - Plan: CBC with Differential/Platelet, COMPLETE METABOLIC PANEL WITH GFR, Lipid panel   Physical exam is normal. Cancer screening is up-to-date.  I will check a CBC, CMP, fasting lipid panel.  Patient declines HIV.  Mammogram and Pap smear up-to-date.  She already has scheduled her colonoscopy for later this year.  I recommended trying Voltaren gel 2 g 4 times daily as needed joint pain in her hand for what I suspect is osteoarthritis of the PIP joints right greater than left.

## 2018-06-05 ENCOUNTER — Telehealth: Payer: Self-pay | Admitting: Family Medicine

## 2018-06-05 NOTE — Telephone Encounter (Signed)
Pt called for lab results from 05/29/18. Pt states she and provider's assistant have missed each other on all ret calls. Pt req call after clinic today on her cell [phone].

## 2018-06-08 ENCOUNTER — Encounter: Payer: Self-pay | Admitting: Internal Medicine

## 2018-06-08 ENCOUNTER — Other Ambulatory Visit: Payer: Self-pay

## 2018-06-08 ENCOUNTER — Ambulatory Visit (AMBULATORY_SURGERY_CENTER): Payer: Self-pay | Admitting: *Deleted

## 2018-06-08 ENCOUNTER — Other Ambulatory Visit: Payer: Self-pay | Admitting: Internal Medicine

## 2018-06-08 VITALS — Ht 61.0 in | Wt 157.8 lb

## 2018-06-08 DIAGNOSIS — Z1211 Encounter for screening for malignant neoplasm of colon: Secondary | ICD-10-CM

## 2018-06-08 MED ORDER — SUPREP BOWEL PREP KIT 17.5-3.13-1.6 GM/177ML PO SOLN: 1.0000 | Freq: Once | ORAL | 0 refills | Status: DC

## 2018-06-08 MED ORDER — NA SULFATE-K SULFATE-MG SULF 17.5-3.13-1.6 GM/177ML PO SOLN: 1.0000 | Freq: Once | ORAL | 0 refills | Status: AC

## 2018-06-08 NOTE — Progress Notes (Signed)
Patient denies any allergies to egg or soy products. Patient denies complications with anesthesia/sedation.  Patient denies oxygen use at home and denies diet medications. Patient denies information on colonoscopy. 

## 2018-06-08 NOTE — Telephone Encounter (Signed)
Spoke with patient and informed her of labs results.

## 2018-06-12 ENCOUNTER — Telehealth: Payer: Self-pay

## 2018-06-12 ENCOUNTER — Telehealth: Payer: Self-pay | Admitting: Internal Medicine

## 2018-06-12 NOTE — Telephone Encounter (Signed)
Pharmacy called to say that Suprep was not covered. Called patient and left message that a sample of Suprpep will be at desk for her to pick up.

## 2018-06-12 NOTE — Telephone Encounter (Signed)
Left message for patient to come by and pick up sample of Suprep.

## 2018-06-19 ENCOUNTER — Ambulatory Visit (AMBULATORY_SURGERY_CENTER): Payer: BLUE CROSS/BLUE SHIELD | Admitting: Internal Medicine

## 2018-06-19 ENCOUNTER — Encounter: Payer: Self-pay | Admitting: Internal Medicine

## 2018-06-19 VITALS — BP 110/63 | HR 61 | Temp 98.9°F | Resp 14 | Ht 62.4 in | Wt 156.0 lb

## 2018-06-19 DIAGNOSIS — D124 Benign neoplasm of descending colon: Secondary | ICD-10-CM | POA: Diagnosis not present

## 2018-06-19 DIAGNOSIS — Z1211 Encounter for screening for malignant neoplasm of colon: Secondary | ICD-10-CM | POA: Diagnosis not present

## 2018-06-19 MED ORDER — SODIUM CHLORIDE 0.9 % IV SOLN: 500.0000 mL | Freq: Once | INTRAVENOUS | Status: DC

## 2018-06-19 NOTE — Patient Instructions (Signed)
Continue present medications. Please read handouts provided.     YOU HAD AN ENDOSCOPIC PROCEDURE TODAY AT THE [facility]:   Refer to the procedure report that was given to you for any specific questions about what was found during the examination.  If the procedure report does not answer your questions, please call your gastroenterologist to clarify.  If you requested that your care partner not be given the details of your procedure findings, then the procedure report has been included in a sealed envelope for you to review at your convenience later.  YOU SHOULD EXPECT: Some feelings of bloating in the abdomen. Passage of more gas than usual.  Walking can help get rid of the air that was put into your GI tract during the procedure and reduce the bloating. If you had a lower endoscopy (such as a colonoscopy or flexible sigmoidoscopy) you may notice spotting of blood in your stool or on the toilet paper. If you underwent a bowel prep for your procedure, you may not have a normal bowel movement for a few days.  Please Note:  You might notice some irritation and congestion in your nose or some drainage.  This is from the oxygen used during your procedure.  There is no need for concern and it should clear up in a day or so.  SYMPTOMS TO REPORT IMMEDIATELY:   Following lower endoscopy (colonoscopy or flexible sigmoidoscopy):  Excessive amounts of blood in the stool  Significant tenderness or worsening of abdominal pains  Swelling of the abdomen that is new, acute  Fever of 100F or higher    For urgent or emergent issues, a gastroenterologist can be reached at any hour by calling [phone].   DIET:  We do recommend a small meal at first, but then you may proceed to your regular diet.  Drink plenty of fluids but you should avoid alcoholic beverages for 24 hours.  ACTIVITY:  You should plan to take it easy for the rest of today and you should NOT DRIVE or use heavy machinery  until tomorrow (because of the sedation medicines used during the test).    FOLLOW UP: Our staff will call the number listed on your records the next business day following your procedure to check on you and address any questions or concerns that you may have regarding the information given to you following your procedure. If we do not reach you, we will leave a message.  However, if you are feeling well and you are not experiencing any problems, there is no need to return our call.  We will assume that you have returned to your regular daily activities without incident.  If any biopsies were taken you will be contacted by phone or by letter within the next 1-3 weeks.  Please call us at [phone] if you have not heard about the biopsies in 3 weeks.    SIGNATURES/CONFIDENTIALITY: You and/or your care partner have signed paperwork which will be entered into your electronic medical record.  These signatures attest to the fact that that the information above on your After Visit Summary has been reviewed and is understood.  Full responsibility of the confidentiality of this discharge information lies with you and/or your care-partner. 

## 2018-06-19 NOTE — Op Note (Signed)
[facility] Patient Name: Alexa Allen Procedure Date: 06/19/2018 9:32 AM MRN: [id number] Endoscopist: Docia Chuck. Henrene Pastor , MD Age: 62 Referring MD:  Date of Birth: 03-15-1956 Gender: Female Account #: [id number] Procedure:                Colonoscopy with cold snare polypectomy x 1 Indications:              Screening for colorectal malignant neoplasm.                            Negative index exam 2009 Medicines:                Monitored Anesthesia Care Procedure:                Pre-Anesthesia Assessment:                           - Prior to the procedure, a History and Physical                            was performed, and patient medications and                            allergies were reviewed. The patient's tolerance of                            previous anesthesia was also reviewed. The risks                            and benefits of the procedure and the sedation                            options and risks were discussed with the patient.                            All questions were answered, and informed consent                            was obtained. Prior Anticoagulants: The patient has                            taken no previous anticoagulant or antiplatelet                            agents. ASA Grade Assessment: II - A patient with                            mild systemic disease. After reviewing the risks                            and benefits, the patient was deemed in                            satisfactory condition to undergo the procedure.  After obtaining informed consent, the colonoscope                            was passed under direct vision. Throughout the                            procedure, the patient's blood pressure, pulse, and                            oxygen saturations were monitored continuously. The                            Model CF-HQ190L [id number]) scope was introduced   through the anus and advanced to the the cecum,                            identified by appendiceal orifice and ileocecal                            valve. The ileocecal valve, appendiceal orifice,                            and rectum were photographed. The quality of the                            bowel preparation was excellent. The colonoscopy                            was performed without difficulty. The patient                            tolerated the procedure well. The bowel preparation                            used was SUPREP. Scope In: 9:39:45 AM Scope Out: 9:50:20 AM Scope Withdrawal Time: 0 hours 9 minutes 1 second  Total Procedure Duration: 0 hours 10 minutes 35 seconds  Findings:                 A 1 mm polyp was found in the descending colon. The                            polyp was removed with a cold snare. Resection and                            retrieval were complete.                           A few small-mouthed diverticula were found in the                            sigmoid colon.                           Internal hemorrhoids were found during retroflexion.  The exam was otherwise without abnormality on                            direct and retroflexion views. Complications:            No immediate complications. Estimated blood loss:                            None. Estimated Blood Loss:     Estimated blood loss: none. Impression:               - One 1 mm polyp in the descending colon, removed                            with a cold snare. Resected and retrieved.                           - Diverticulosis in the sigmoid colon.                           - Internal hemorrhoids.                           - The examination was otherwise normal on direct                            and retroflexion views. Recommendation:           - Repeat colonoscopy in 5-10 years for surveillance.                           - Patient has a contact number  available for                            emergencies. The signs and symptoms of potential                            delayed complications were discussed with the                            patient. Return to normal activities tomorrow.                            Written discharge instructions were provided to the                            patient.                           - Resume previous diet.                           - Continue present medications.                           - Await pathology results. Docia Chuck. Henrene Pastor, MD 06/19/2018 9:55:22 AM This report has been signed electronically.

## 2018-06-19 NOTE — Progress Notes (Signed)
Pt's states no medical or surgical changes since previsit or office visit. 

## 2018-06-19 NOTE — Progress Notes (Signed)
Called to room to assist during endoscopic procedure.  Patient ID and intended procedure confirmed with present staff. Received instructions for my participation in the procedure from the performing physician.  

## 2018-06-19 NOTE — Progress Notes (Signed)
PT taken to PACU. Monitors in place. VSS. Report given to RN. 

## 2018-06-22 ENCOUNTER — Telehealth: Payer: Self-pay

## 2018-06-22 NOTE — Telephone Encounter (Signed)
  Follow up Call-  Call back number 06/19/2018  Post procedure Call Back phone  # [phone]  Permission to leave phone message Yes  Some recent data might be hidden     Patient questions:  Do you have a fever, pain , or abdominal swelling? No. Pain Score  0 *  Have you tolerated food without any problems? Yes.    Have you been able to return to your normal activities? Yes.    Do you have any questions about your discharge instructions: Diet   No. Medications  No. Follow up visit  No.  Do you have questions or concerns about your Care? No.  Actions: * If pain score is 4 or above: No action needed, pain <4.

## 2018-06-22 NOTE — Telephone Encounter (Signed)
No answer, left message to call back later today, B.Taden Witter RN. 

## 2018-07-01 ENCOUNTER — Encounter: Payer: Self-pay | Admitting: Internal Medicine

## 2018-08-07 ENCOUNTER — Other Ambulatory Visit: Payer: Self-pay | Admitting: Family Medicine

## 2018-10-06 ENCOUNTER — Ambulatory Visit: Payer: BLUE CROSS/BLUE SHIELD

## 2018-10-06 ENCOUNTER — Other Ambulatory Visit: Payer: Self-pay | Admitting: Family Medicine

## 2018-10-06 ENCOUNTER — Telehealth: Payer: Self-pay | Admitting: Family Medicine

## 2018-10-06 MED ORDER — OSELTAMIVIR PHOSPHATE 75 MG PO CAPS: 75.0000 mg | ORAL_CAPSULE | Freq: Two times a day (BID) | ORAL | 0 refills | Status: DC

## 2018-10-06 NOTE — Telephone Encounter (Signed)
I ordered at [facility].

## 2018-10-06 NOTE — Telephone Encounter (Signed)
Pt has been exposed to the flu and is now having sore throat wants [place] to call in tamiflu to [facility].

## 2018-10-07 NOTE — Telephone Encounter (Signed)
Pt aware via vm 

## 2019-04-05 ENCOUNTER — Ambulatory Visit: Payer: BLUE CROSS/BLUE SHIELD | Admitting: Family Medicine

## 2019-04-05 ENCOUNTER — Other Ambulatory Visit: Payer: Self-pay

## 2019-04-05 ENCOUNTER — Encounter: Payer: Self-pay | Admitting: Family Medicine

## 2019-04-05 VITALS — BP 136/76 | HR 70 | Temp 98.1°F | Resp 16 | Ht 62.0 in | Wt 156.0 lb

## 2019-04-05 DIAGNOSIS — M19042 Primary osteoarthritis, left hand: Secondary | ICD-10-CM

## 2019-04-05 DIAGNOSIS — M19041 Primary osteoarthritis, right hand: Secondary | ICD-10-CM

## 2019-04-05 MED ORDER — MELOXICAM 15 MG PO TABS: 15.0000 mg | ORAL_TABLET | Freq: Every day | ORAL | 4 refills | Status: DC

## 2019-04-05 NOTE — Progress Notes (Signed)
Subjective:    Patient ID: Alexa Allen, female    DOB: 04/16/1956, 63 y.o.   MRN: [id number]  HPI Patient presents today complaining of pains in both hands.  The pain is primarily located in the MCP and PIP joints of the third and fourth fingers on both hands.  The pain is worse throughout the day with activity.  She also reports stiffness.  She is not able to completely make a fist with either hand.  She does have some numbness and tingling in both hands however she has a negative Phalen sign today and a negative Tinel sign.  She has no erythema or warmth in either hand.  There is no evidence of active synovitis.  She denies any fevers or chills. Past Medical History:  Diagnosis Date  . Arthritis    hands  . Elevated liver enzymes   . GERD (gastroesophageal reflux disease)   . HSV (herpes simplex virus) infection   . Liver cyst   . SVD (spontaneous vaginal delivery)    x 2  . Wears partial dentures    upper   Past Surgical History:  Procedure Laterality Date  . bladder tack    . COLONOSCOPY     02/2008   . MULTIPLE TOOTH EXTRACTIONS    . TUBAL LIGATION    . VAGINAL HYSTERECTOMY    . WISDOM TOOTH EXTRACTION     Current Outpatient Medications on File Prior to Visit  Medication Sig Dispense Refill  . diclofenac sodium (VOLTAREN) 1 % GEL Apply 2 g topically 4 (four) times daily. 100 g 3  . Multiple Vitamin (MULTIVITAMIN) tablet Take 1 tablet by mouth daily.    Marland Kitchen omeprazole (PRILOSEC) 20 MG capsule Take 20 mg by mouth daily as needed.    . valACYclovir (VALTREX) 500 MG tablet TAKE 1 TABLET DAILY 90 tablet 4   No current facility-administered medications on file prior to visit.    Allergies  Allergen Reactions  . Oxycodone-Acetaminophen Other (See Comments)    Hallucinations and itching  . Iohexol      Code: HIVES, Desc: hives with IV cm- tolerated 1 hour quick prep well, Onset Date: [id number]    Social History   Socioeconomic History  . Marital status: Married   Spouse name: Not on file  . Number of children: 2  . Years of education: Not on file  . Highest education level: Not on file  Occupational History  . Not on file  Social Needs  . Financial resource strain: Not on file  . Food insecurity    Worry: Not on file    Inability: Not on file  . Transportation needs    Medical: Not on file    Non-medical: Not on file  Tobacco Use  . Smoking status: Former Smoker    Packs/day: 0.25    Types: Cigarettes  . Smokeless tobacco: Never Used  . Tobacco comment: Quit 2011  Substance and Sexual Activity  . Alcohol use: Yes    Alcohol/week: 1.0 - 2.0 standard drinks    Types: 1 - 2 Cans of beer per week  . Drug use: No  . Sexual activity: Yes    Birth control/protection: Post-menopausal    Comment: Hysterectomy  Lifestyle  . Physical activity    Days per week: Not on file    Minutes per session: Not on file  . Stress: Not on file  Relationships  . Social connections    Talks on phone: Not on file  Gets together: Not on file    Attends religious service: Not on file    Active member of club or organization: Not on file    Attends meetings of clubs or organizations: Not on file    Relationship status: Not on file  . Intimate partner violence    Fear of current or ex partner: Not on file    Emotionally abused: Not on file    Physically abused: Not on file    Forced sexual activity: Not on file  Other Topics Concern  . Not on file  Social History Narrative  . Not on file      Review of Systems  All other systems reviewed and are negative.      Objective:   Physical Exam Vitals signs reviewed.  Constitutional:      Appearance: Normal appearance. She is normal weight.  Cardiovascular:     Rate and Rhythm: Normal rate and regular rhythm.     Heart sounds: Normal heart sounds.  Pulmonary:     Effort: Pulmonary effort is normal.     Breath sounds: Normal breath sounds.  Musculoskeletal:     Left hand: She exhibits  decreased range of motion and tenderness. She exhibits no bony tenderness, normal capillary refill, no deformity and no swelling. Normal sensation noted. Normal strength noted.       Hands:  Neurological:     Mental Status: She is alert.           Assessment & Plan:  The encounter diagnosis was Osteoarthritis of fingers of both hands. I suspect that the patient has osteoarthritis in the MCP and PIP joints on both hands.  I doubt rheumatoid given the lack of active synovitis on exam today.  I have recommended Mobic 15 mg p.o. daily.  She is already on omeprazole which should afford some level of GI protection.  Of also recommended that she try to [facility] and glucosamine/chondroitin to see if this provides any additional relief.  If symptoms worsen, consider screen the patient for rheumatoid arthritis.

## 2019-04-30 DIAGNOSIS — Z1389 Encounter for screening for other disorder: Secondary | ICD-10-CM | POA: Diagnosis not present

## 2019-04-30 DIAGNOSIS — Z01419 Encounter for gynecological examination (general) (routine) without abnormal findings: Secondary | ICD-10-CM | POA: Diagnosis not present

## 2019-04-30 DIAGNOSIS — E669 Obesity, unspecified: Secondary | ICD-10-CM | POA: Diagnosis not present

## 2019-04-30 DIAGNOSIS — Z130 Encounter for screening for diseases of the blood and blood-forming organs and certain disorders involving the immune mechanism: Secondary | ICD-10-CM | POA: Diagnosis not present

## 2019-04-30 DIAGNOSIS — Z1231 Encounter for screening mammogram for malignant neoplasm of breast: Secondary | ICD-10-CM | POA: Diagnosis not present

## 2019-04-30 LAB — HM MAMMOGRAPHY

## 2019-06-16 ENCOUNTER — Telehealth: Payer: Self-pay | Admitting: Family Medicine

## 2019-06-16 NOTE — Telephone Encounter (Signed)
Pt called and wanted to know if we could refer her to a hand specialist about the pain in her hands?

## 2019-06-17 ENCOUNTER — Other Ambulatory Visit: Payer: Self-pay | Admitting: Family Medicine

## 2019-06-17 DIAGNOSIS — M79641 Pain in right hand: Secondary | ICD-10-CM

## 2019-06-17 DIAGNOSIS — M79642 Pain in left hand: Secondary | ICD-10-CM

## 2019-06-17 DIAGNOSIS — M19042 Primary osteoarthritis, left hand: Secondary | ICD-10-CM

## 2019-06-17 DIAGNOSIS — M19041 Primary osteoarthritis, right hand: Secondary | ICD-10-CM

## 2019-06-17 NOTE — Telephone Encounter (Signed)
Okay with referral to a hand specialist however I would also recommend that she swing by and allow [place] to check a sed rate and rheumatoid factor to rule out rheumatoid arthritis

## 2019-06-17 NOTE — Telephone Encounter (Signed)
Pt aware via vm and orders placed.

## 2019-06-18 ENCOUNTER — Other Ambulatory Visit: Payer: BC Managed Care – PPO

## 2019-06-18 DIAGNOSIS — M79642 Pain in left hand: Secondary | ICD-10-CM

## 2019-06-18 DIAGNOSIS — M79641 Pain in right hand: Secondary | ICD-10-CM | POA: Diagnosis not present

## 2019-06-18 DIAGNOSIS — M19041 Primary osteoarthritis, right hand: Secondary | ICD-10-CM | POA: Diagnosis not present

## 2019-06-18 DIAGNOSIS — M19042 Primary osteoarthritis, left hand: Secondary | ICD-10-CM | POA: Diagnosis not present

## 2019-06-19 LAB — RHEUMATOID FACTOR: Rhuematoid fact SerPl-aCnc: <14 IU/mL (ref <14)

## 2019-06-19 LAB — SEDIMENTATION RATE: Sed Rate: 6 mm/h (ref 0–30)

## 2019-06-29 ENCOUNTER — Encounter: Payer: BC Managed Care – PPO | Admitting: Family Medicine

## 2019-07-05 ENCOUNTER — Other Ambulatory Visit: Payer: Self-pay

## 2019-07-06 ENCOUNTER — Ambulatory Visit (INDEPENDENT_AMBULATORY_CARE_PROVIDER_SITE_OTHER): Payer: BC Managed Care – PPO | Admitting: Family Medicine

## 2019-07-06 ENCOUNTER — Encounter: Payer: Self-pay | Admitting: Family Medicine

## 2019-07-06 VITALS — BP 118/80 | HR 72 | Temp 97.7°F | Resp 16 | Ht 62.0 in | Wt 157.0 lb

## 2019-07-06 DIAGNOSIS — Z0000 Encounter for general adult medical examination without abnormal findings: Secondary | ICD-10-CM

## 2019-07-06 DIAGNOSIS — Z13220 Encounter for screening for lipoid disorders: Secondary | ICD-10-CM | POA: Diagnosis not present

## 2019-07-06 MED ORDER — CELECOXIB 200 MG PO CAPS: 200.0000 mg | ORAL_CAPSULE | Freq: Two times a day (BID) | ORAL | 1 refills | Status: DC

## 2019-07-06 NOTE — Progress Notes (Signed)
Subjective:    Patient ID: Alexa Allen, female    DOB: [birth year], 63 y.o.   MRN: [id number]  HPI Patient is a very pleasant 63 y/o WF here for CPE.  Last colonoscopy was 2019.   Mammogram and pap smear were performed performed at her GYN office (Dr. Ulanda Edison) in September.  She consents to have US obtain records from his office.  She continues to have pain and stiffness in her hands.  This primarily involves the DIP joints of the fourth digits bilaterally.  She also has pain in the PIP joints of the fourth digits bilaterally.  She also reports occasionally dropping things from her hands due to weakness in her hands.  She has a positive Tinel's sign today as well as a positive Phalen sign suggesting an element of carpal tunnel syndrome.  However the pain and stiffness at the PIP and DIP joints is more consistent with osteoarthritis.  She has tried meloxicam with no success.  She is using Voltaren cream with minimal relief.  Otherwise she is doing well with no concerns.  Immunization records are below: Immunization History  Administered Date(s) Administered  . Influenza,inj,Quad PF,6+ Mos 05/18/2014, 05/15/2016  . Influenza-Unspecified 05/22/2018, 06/22/2019  . Zoster 05/16/2016    Past Medical History:  Diagnosis Date  . Arthritis    hands  . Elevated liver enzymes   . GERD (gastroesophageal reflux disease)   . HSV (herpes simplex virus) infection   . Liver cyst   . SVD (spontaneous vaginal delivery)    x 2  . Wears partial dentures    upper   Past Surgical History:  Procedure Laterality Date  . bladder tack    . COLONOSCOPY     02/2008   . MULTIPLE TOOTH EXTRACTIONS    . TUBAL LIGATION    . VAGINAL HYSTERECTOMY    . WISDOM TOOTH EXTRACTION     Current Outpatient Medications on File Prior to Visit  Medication Sig Dispense Refill  . diclofenac sodium (VOLTAREN) 1 % GEL Apply 2 g topically 4 (four) times daily. 100 g 3  . Multiple Vitamin (MULTIVITAMIN) tablet Take 1 tablet  by mouth daily.    Marland Kitchen omeprazole (PRILOSEC) 20 MG capsule Take 20 mg by mouth daily as needed.    . valACYclovir (VALTREX) 500 MG tablet TAKE 1 TABLET DAILY 90 tablet 4   No current facility-administered medications on file prior to visit.    Allergies  Allergen Reactions  . Oxycodone-Acetaminophen Other (See Comments)    Hallucinations and itching  . Iohexol      Code: HIVES, Desc: hives with IV cm- tolerated 1 hour quick prep well, Onset Date: [id number]    Social History   Socioeconomic History  . Marital status: Married    Spouse name: Not on file  . Number of children: 2  . Years of education: Not on file  . Highest education level: Not on file  Occupational History  . Not on file  Social Needs  . Financial resource strain: Not on file  . Food insecurity    Worry: Not on file    Inability: Not on file  . Transportation needs    Medical: Not on file    Non-medical: Not on file  Tobacco Use  . Smoking status: Former Smoker    Packs/day: 0.25    Types: Cigarettes  . Smokeless tobacco: Never Used  . Tobacco comment: Quit 2011  Substance and Sexual Activity  . Alcohol use: Yes  Alcohol/week: 1.0 - 2.0 standard drinks    Types: 1 - 2 Cans of beer per week  . Drug use: No  . Sexual activity: Yes    Birth control/protection: Post-menopausal    Comment: Hysterectomy  Lifestyle  . Physical activity    Days per week: Not on file    Minutes per session: Not on file  . Stress: Not on file  Relationships  . Social Herbalist on phone: Not on file    Gets together: Not on file    Attends religious service: Not on file    Active member of club or organization: Not on file    Attends meetings of clubs or organizations: Not on file    Relationship status: Not on file  . Intimate partner violence    Fear of current or ex partner: Not on file    Emotionally abused: Not on file    Physically abused: Not on file    Forced sexual activity: Not on file  Other  Topics Concern  . Not on file  Social History Narrative  . Not on file     Review of Systems  All other systems reviewed and are negative.      Objective:   Physical Exam  Constitutional: She is oriented to person, place, and time. She appears well-developed and well-nourished. No distress.  HENT:  Head: Normocephalic and atraumatic.  Right Ear: External ear normal.  Left Ear: External ear normal.  Nose: Nose normal.  Mouth/Throat: Oropharynx is clear and moist. No oropharyngeal exudate.  Eyes: Pupils are equal, round, and reactive to light. Conjunctivae and EOM are normal. Right eye exhibits no discharge. Left eye exhibits no discharge. No scleral icterus.  Neck: Normal range of motion. Neck supple. No JVD present. No tracheal deviation present. No thyromegaly present.  Cardiovascular: Normal rate, regular rhythm, normal heart sounds and intact distal pulses. Exam reveals no gallop and no friction rub.  No murmur heard. Pulmonary/Chest: Effort normal and breath sounds normal. No stridor. No respiratory distress. She has no wheezes. She has no rales. She exhibits no tenderness.  Abdominal: Soft. Bowel sounds are normal. She exhibits no distension and no mass. There is no abdominal tenderness. There is no rebound and no guarding.  Musculoskeletal: Normal range of motion.        General: No tenderness or edema.  Lymphadenopathy:    She has no cervical adenopathy.  Neurological: She is alert and oriented to person, place, and time. She has normal reflexes. No cranial nerve deficit. She exhibits normal muscle tone. Coordination normal.  Skin: Skin is warm. No rash noted. She is not diaphoretic. No erythema. No pallor.  Psychiatric: She has a normal mood and affect. Her behavior is normal. Judgment and thought content normal.  Vitals reviewed.   Patient has an erythematous warty papule on her upper left chest just adjacent to the sternum.  This is roughly 1 cm in diameter and appears  to be an irritated seborrheic keratosis.  I have recommended that the patient monitor this area and if the erythema does not resolve, return so that we can perform a shave biopsy to rule out possible squamous cell carcinoma.  The patient agrees and will watch the area closely      Assessment & Plan:  General medical exam - Plan: COMPLETE METABOLIC PANEL WITH GFR, Lipid Panel, CBC with Differential   Physical exam is normal. Cancer screening is up-to-date.  I will check a CBC,  CMP, fasting lipid panel.  We will obtain the patient's mammogram records from her gynecologist.  She did not have a Pap smear this year.  Her colonoscopy was performed last year and is up-to-date.  I would recommend a bone density test when she turns 65.  Immunizations are up-to-date except for tetanus shot which she politely declines today.  We will add Celebrex 200 mg twice daily for osteoarthritis and I have recommended the patient see a hand specialist for possible carpal tunnel syndrome in her wrist bilaterally.  She will monitor the area on her left chest and if it does not resolve she will return for shave biopsy.

## 2019-07-07 LAB — COMPLETE METABOLIC PANEL WITH GFR
AG Ratio: 1.8 (calc) (ref 1.0–2.5)
ALT: 34 U/L — ABNORMAL HIGH (ref 6–29)
AST: 27 U/L (ref 10–35)
Albumin: 4.3 g/dL (ref 3.6–5.1)
Alkaline phosphatase (APISO): 85 U/L (ref 37–153)
BUN: 16 mg/dL (ref 7–25)
CO2: 26 mmol/L (ref 20–32)
Calcium: 9.5 mg/dL (ref 8.6–10.4)
Chloride: 103 mmol/L (ref 98–110)
Creat: 0.75 mg/dL (ref 0.50–0.99)
GFR, Est African American: 98 mL/min/{1.73_m2} (ref >=60)
GFR, Est Non African American: 85 mL/min/{1.73_m2} (ref >=60)
Globulin: 2.4 g/dL (calc) (ref 1.9–3.7)
Glucose, Bld: 88 mg/dL (ref 65–99)
Potassium: 4.2 mmol/L (ref 3.5–5.3)
Sodium: 139 mmol/L (ref 135–146)
Total Bilirubin: 0.6 mg/dL (ref 0.2–1.2)
Total Protein: 6.7 g/dL (ref 6.1–8.1)

## 2019-07-07 LAB — CBC WITH DIFFERENTIAL/PLATELET
Absolute Monocytes: 477 cells/uL (ref 200–950)
Basophils Absolute: 62 cells/uL (ref 0–200)
Basophils Relative: 1 %
Eosinophils Absolute: 161 cells/uL (ref 15–500)
Eosinophils Relative: 2.6 %
HCT: 39.8 % (ref 35.0–45.0)
Hemoglobin: 13.2 g/dL (ref 11.7–15.5)
Lymphs Abs: 2213 cells/uL (ref 850–3900)
MCH: 29.7 pg (ref 27.0–33.0)
MCHC: 33.2 g/dL (ref 32.0–36.0)
MCV: 89.6 fL (ref 80.0–100.0)
MPV: 11.7 fL (ref 7.5–12.5)
Monocytes Relative: 7.7 %
Neutro Abs: 3286 cells/uL (ref 1500–7800)
Neutrophils Relative %: 53 %
Platelets: 191 10*3/uL (ref 140–400)
RBC: 4.44 10*6/uL (ref 3.80–5.10)
RDW: 12.3 % (ref 11.0–15.0)
Total Lymphocyte: 35.7 %
WBC: 6.2 10*3/uL (ref 3.8–10.8)

## 2019-07-07 LAB — LIPID PANEL
Cholesterol: 234 mg/dL — ABNORMAL HIGH (ref <200)
HDL: 63 mg/dL (ref >=50)
LDL Cholesterol (Calc): 156 mg/dL (calc) — ABNORMAL HIGH
Non-HDL Cholesterol (Calc): 171 mg/dL (calc) — ABNORMAL HIGH (ref <130)
Total CHOL/HDL Ratio: 3.7 (calc) (ref <5.0)
Triglycerides: 54 mg/dL (ref <150)

## 2019-07-20 ENCOUNTER — Encounter: Payer: Self-pay | Admitting: Family Medicine

## 2019-07-20 ENCOUNTER — Ambulatory Visit: Payer: BC Managed Care – PPO | Admitting: Family Medicine

## 2019-07-20 ENCOUNTER — Other Ambulatory Visit: Payer: Self-pay

## 2019-07-20 VITALS — BP 110/78 | HR 76 | Temp 97.1°F | Resp 16 | Ht 62.0 in | Wt 157.0 lb

## 2019-07-20 DIAGNOSIS — L308 Other specified dermatitis: Secondary | ICD-10-CM | POA: Diagnosis not present

## 2019-07-20 DIAGNOSIS — L989 Disorder of the skin and subcutaneous tissue, unspecified: Secondary | ICD-10-CM

## 2019-07-20 DIAGNOSIS — D485 Neoplasm of uncertain behavior of skin: Secondary | ICD-10-CM | POA: Diagnosis not present

## 2019-07-20 MED ORDER — ATORVASTATIN CALCIUM 20 MG PO TABS: 20.0000 mg | ORAL_TABLET | Freq: Every day | ORAL | 3 refills | Status: DC

## 2019-07-20 NOTE — Progress Notes (Signed)
Subjective:    Patient ID: Alexa Allen, female    DOB: 05-14-1956, 63 y.o.   MRN: [id number]  HPI  At the patient's physical exam, I noticed an atypical lesion on her left chest wall just to the left of her sternum.  She is here today because the lesion persists and she requested shave biopsy.  The lesion is 10 mm x 7 mm.  It is an erythematous poorly circumscribed papule that is tender to the touch with brownish discoloration in the center.  Please see the photograph below:   Lesion appears to be either an irritated seborrheic keratosis however I cannot rule out an atypical mole.  The lesion has persisted now for several weeks and the patient would like to have a biopsy.  She also has a 5 mm wartlike papule on a pedunculated stalk on the medial left bicep.  She also has a 2 mm wartlike papule on the posterior right shoulder.  She request that I treat these with liquid nitrogen cryotherapy.  Both appear to be SKs Past Medical History:  Diagnosis Date  . Arthritis    hands  . Elevated liver enzymes   . GERD (gastroesophageal reflux disease)   . HSV (herpes simplex virus) infection   . Liver cyst   . SVD (spontaneous vaginal delivery)    x 2  . Wears partial dentures    upper   Past Surgical History:  Procedure Laterality Date  . bladder tack    . COLONOSCOPY     02/2008   . MULTIPLE TOOTH EXTRACTIONS    . TUBAL LIGATION    . VAGINAL HYSTERECTOMY    . WISDOM TOOTH EXTRACTION     Current Outpatient Medications on File Prior to Visit  Medication Sig Dispense Refill  . celecoxib (CELEBREX) 200 MG capsule Take 1 capsule (200 mg total) by mouth 2 (two) times daily. 60 capsule 1  . diclofenac sodium (VOLTAREN) 1 % GEL Apply 2 g topically 4 (four) times daily. 100 g 3  . Multiple Vitamin (MULTIVITAMIN) tablet Take 1 tablet by mouth daily.    Marland Kitchen omeprazole (PRILOSEC) 20 MG capsule Take 20 mg by mouth daily as needed.    . valACYclovir (VALTREX) 500 MG tablet TAKE 1 TABLET DAILY 90  tablet 4   No current facility-administered medications on file prior to visit.    Allergies  Allergen Reactions  . Oxycodone-Acetaminophen Other (See Comments)    Hallucinations and itching  . Iohexol      Code: HIVES, Desc: hives with IV cm- tolerated 1 hour quick prep well, Onset Date: [id number]    Social History   Socioeconomic History  . Marital status: Married    Spouse name: Not on file  . Number of children: 2  . Years of education: Not on file  . Highest education level: Not on file  Occupational History  . Not on file  Social Needs  . Financial resource strain: Not on file  . Food insecurity    Worry: Not on file    Inability: Not on file  . Transportation needs    Medical: Not on file    Non-medical: Not on file  Tobacco Use  . Smoking status: Former Smoker    Packs/day: 0.25    Types: Cigarettes  . Smokeless tobacco: Never Used  . Tobacco comment: Quit 2011  Substance and Sexual Activity  . Alcohol use: Yes    Alcohol/week: 1.0 - 2.0 standard drinks    Types: 1 -  2 Cans of beer per week  . Drug use: No  . Sexual activity: Yes    Birth control/protection: Post-menopausal    Comment: Hysterectomy  Lifestyle  . Physical activity    Days per week: Not on file    Minutes per session: Not on file  . Stress: Not on file  Relationships  . Social Herbalist on phone: Not on file    Gets together: Not on file    Attends religious service: Not on file    Active member of club or organization: Not on file    Attends meetings of clubs or organizations: Not on file    Relationship status: Not on file  . Intimate partner violence    Fear of current or ex partner: Not on file    Emotionally abused: Not on file    Physically abused: Not on file    Forced sexual activity: Not on file  Other Topics Concern  . Not on file  Social History Narrative  . Not on file      Review of Systems  All other systems reviewed and are negative.       Objective:   Physical Exam Vitals signs reviewed.  Cardiovascular:     Rate and Rhythm: Normal rate and regular rhythm.  Pulmonary:     Effort: Pulmonary effort is normal.     Breath sounds: Normal breath sounds.  Chest:    Musculoskeletal:       Arms:  Skin:    Findings: Lesion present.           Assessment & Plan:  Skin lesion of chest wall - Plan: Pathology Report ([facility])  The lesion on the medial left bicep and the lesion on the posterior right shoulder appear to be small SKs.  Each of these lesions was treated with liquid nitrogen cryotherapy for a total of 30 seconds.  These do not appear to be pathologic and so no biopsy was performed.  The lesion in the center of the chest is the one photographed.  This area was anesthetized with 0.1% lidocaine with epinephrine.  A chaperone was present during the entire exam and procedure.  The lesion was then biopsied using sterile technique with a shave biopsy method.  The lesion was sent to pathology in a labeled container and hemostasis was achieved with Drysol and a Band-Aid.  Await the results of the biopsy.

## 2019-07-22 ENCOUNTER — Encounter: Payer: Self-pay | Admitting: *Deleted

## 2019-07-23 LAB — TISSUE SPECIMEN

## 2019-07-23 LAB — PATHOLOGY REPORT

## 2019-08-30 ENCOUNTER — Other Ambulatory Visit: Payer: Self-pay | Admitting: Family Medicine

## 2019-09-08 ENCOUNTER — Other Ambulatory Visit: Payer: Self-pay | Admitting: Family Medicine

## 2019-09-08 MED ORDER — CELECOXIB 200 MG PO CAPS: 200.0000 mg | ORAL_CAPSULE | Freq: Two times a day (BID) | ORAL | 3 refills | Status: DC

## 2019-09-08 MED ORDER — ATORVASTATIN CALCIUM 20 MG PO TABS: 20.0000 mg | ORAL_TABLET | Freq: Every day | ORAL | 3 refills | Status: DC

## 2019-10-29 ENCOUNTER — Other Ambulatory Visit: Payer: Self-pay | Admitting: Family Medicine

## 2019-11-16 ENCOUNTER — Ambulatory Visit: Payer: BC Managed Care – PPO | Admitting: Nurse Practitioner

## 2019-11-16 ENCOUNTER — Other Ambulatory Visit: Payer: Self-pay

## 2019-11-16 VITALS — BP 138/88 | HR 73 | Temp 97.6°F | Resp 18 | Ht 62.0 in | Wt 162.0 lb

## 2019-11-16 DIAGNOSIS — H6091 Unspecified otitis externa, right ear: Secondary | ICD-10-CM

## 2019-11-16 MED ORDER — OFLOXACIN 0.3 % OT SOLN: 5.0000 [drp] | Freq: Every day | OTIC | 0 refills | Status: DC

## 2019-11-16 NOTE — Progress Notes (Signed)
Acute Office Visit  Subjective:    Patient ID: Alexa Allen, female    DOB: [birth year], 64 y.o.   MRN: [id number]  Chief Complaint  Patient presents with  . Ear Pain    laying down gets swimmy headed and hears squishing sounds, started x3 weeks, no meds    HPI Patient is a 64 year old female presenting to clinic with c/o ear pain and pressure that started appro. 2 days ago. She has tried no treatment. She has no other associated sxs. She noticed last night that when she lies down her hearing changes to a swishing sound.no cp/ct, gu/gi sxs, fever/chills, nasal congestion or drainage. No sick contacts.     Past Medical History:  Diagnosis Date  . Arthritis    hands  . Elevated liver enzymes   . GERD (gastroesophageal reflux disease)   . HSV (herpes simplex virus) infection   . Liver cyst   . SVD (spontaneous vaginal delivery)    x 2  . Wears partial dentures    upper    Past Surgical History:  Procedure Laterality Date  . bladder tack    . COLONOSCOPY     02/2008   . MULTIPLE TOOTH EXTRACTIONS    . TUBAL LIGATION    . VAGINAL HYSTERECTOMY    . WISDOM TOOTH EXTRACTION      Family History  Problem Relation Age of Onset  . Colon cancer Neg Hx   . Rectal cancer Neg Hx   . Stomach cancer Neg Hx     Social History   Socioeconomic History  . Marital status: Married    Spouse name: Not on file  . Number of children: 2  . Years of education: Not on file  . Highest education level: Not on file  Occupational History  . Not on file  Tobacco Use  . Smoking status: Former Smoker    Packs/day: 0.25    Types: Cigarettes  . Smokeless tobacco: Never Used  . Tobacco comment: Quit 2011  Substance and Sexual Activity  . Alcohol use: Yes    Alcohol/week: 1.0 - 2.0 standard drinks    Types: 1 - 2 Cans of beer per week  . Drug use: No  . Sexual activity: Yes    Birth control/protection: Post-menopausal    Comment: Hysterectomy  Other Topics Concern  . Not on file    Social History Narrative  . Not on file   Social Determinants of Health   Financial Resource Strain:   . Difficulty of Paying Living Expenses:   Food Insecurity:   . Worried About Charity fundraiser in the Last Year:   . Arboriculturist in the Last Year:   Transportation Needs:   . Film/video editor (Medical):   Marland Kitchen Lack of Transportation (Non-Medical):   Physical Activity:   . Days of Exercise per Week:   . Minutes of Exercise per Session:   Stress:   . Feeling of Stress :   Social Connections:   . Frequency of Communication with Friends and Family:   . Frequency of Social Gatherings with Friends and Family:   . Attends Religious Services:   . Active Member of Clubs or Organizations:   . Attends Archivist Meetings:   Marland Kitchen Marital Status:   Intimate Partner Violence:   . Fear of Current or Ex-Partner:   . Emotionally Abused:   Marland Kitchen Physically Abused:   . Sexually Abused:     Outpatient Medications  Prior to Visit  Medication Sig Dispense Refill  . atorvastatin (LIPITOR) 20 MG tablet Take 1 tablet (20 mg total) by mouth daily. 90 tablet 3  . celecoxib (CELEBREX) 200 MG capsule Take 1 capsule (200 mg total) by mouth 2 (two) times daily. 180 capsule 3  . diclofenac sodium (VOLTAREN) 1 % GEL Apply 2 g topically 4 (four) times daily. 100 g 3  . Multiple Vitamin (MULTIVITAMIN) tablet Take 1 tablet by mouth daily.    Marland Kitchen omeprazole (PRILOSEC) 20 MG capsule Take 20 mg by mouth daily as needed.    . valACYclovir (VALTREX) 500 MG tablet TAKE 1 TABLET DAILY 90 tablet 3   No facility-administered medications prior to visit.    Allergies  Allergen Reactions  . Oxycodone-Acetaminophen Other (See Comments)    Hallucinations and itching  . Iohexol      Code: HIVES, Desc: hives with IV cm- tolerated 1 hour quick prep well, Onset Date: [id number]     Review of Systems  All other systems reviewed and are negative.      Objective:    Physical Exam Vitals and nursing  note reviewed.  Constitutional:      General: She is not in acute distress.    Appearance: Normal appearance. She is not ill-appearing.  HENT:     Head: Normocephalic.     Right Ear: Hearing normal. Tenderness present. There is no impacted cerumen. Tympanic membrane is erythematous. Tympanic membrane is not injected, scarred, perforated, retracted or bulging.     Left Ear: Hearing, tympanic membrane, ear canal and external ear normal.     Nose: Nose normal.     Mouth/Throat:     Mouth: Mucous membranes are moist.     Pharynx: Oropharynx is clear.  Eyes:     General: Lids are normal. Lids are everted, no foreign bodies appreciated.  Cardiovascular:     Rate and Rhythm: Normal rate and regular rhythm.     Pulses: Normal pulses.     Heart sounds: Normal heart sounds.  Pulmonary:     Effort: Pulmonary effort is normal.     Breath sounds: Normal breath sounds.  Musculoskeletal:        General: Normal range of motion.     Cervical back: Normal range of motion and neck supple.  Skin:    General: Skin is warm and dry.     Capillary Refill: Capillary refill takes less than 2 seconds.  Neurological:     General: No focal deficit present.     Mental Status: She is alert and oriented to person, place, and time.     BP 138/88 (BP Location: Left Arm, Patient Position: Sitting, Cuff Size: Normal)   Pulse 73   Temp 97.6 F (36.4 C) (Temporal)   Resp 18   Ht 5\' 2"  (1.575 m)   Wt 162 lb (73.5 kg)   LMP  (LMP Unknown)   SpO2 95%   BMI 29.63 kg/m  Wt Readings from Last 3 Encounters:  11/16/19 162 lb (73.5 kg)  07/20/19 157 lb (71.2 kg)  07/06/19 157 lb (71.2 kg)    There are no preventive care reminders to display for this patient.  There are no preventive care reminders to display for this patient.   Lab Results  Component Value Date   TSH 0.849 04/21/2015   Lab Results  Component Value Date   WBC 6.2 07/06/2019   HGB 13.2 07/06/2019   HCT 39.8 07/06/2019   MCV 89.6  07/06/2019  PLT 191 07/06/2019   Lab Results  Component Value Date   NA 139 07/06/2019   K 4.2 07/06/2019   CO2 26 07/06/2019   GLUCOSE 88 07/06/2019   BUN 16 07/06/2019   CREATININE 0.75 07/06/2019   BILITOT 0.6 07/06/2019   ALKPHOS 84 05/16/2016   AST 27 07/06/2019   ALT 34 (H) 07/06/2019   PROT 6.7 07/06/2019   ALBUMIN 4.3 05/16/2016   CALCIUM 9.5 07/06/2019   Lab Results  Component Value Date   CHOL 234 (H) 07/06/2019   Lab Results  Component Value Date   HDL 63 07/06/2019   Lab Results  Component Value Date   LDLCALC 156 (H) 07/06/2019   Lab Results  Component Value Date   TRIG 54 07/06/2019   Lab Results  Component Value Date   CHOLHDL 3.7 07/06/2019   No results found for: HGBA1C     Assessment & Plan:   Problem List Items Addressed This Visit    None    Visit Diagnoses    Otitis externa of right ear, unspecified chronicity, unspecified type    -  Primary   Relevant Medications   ofloxacin (FLOXIN) 0.3 % OTIC solution       Meds ordered this encounter  Medications  . ofloxacin (FLOXIN) 0.3 % OTIC solution    Sig: Place 5 drops into the right ear daily for 7 days.    Dispense:  1.8 mL    Refill:  0   Follow Up: for non resolving or worsening sxs.  Annie Main, FNP

## 2020-01-10 ENCOUNTER — Ambulatory Visit: Payer: BC Managed Care – PPO | Admitting: Family Medicine

## 2020-01-10 ENCOUNTER — Other Ambulatory Visit: Payer: Self-pay

## 2020-01-10 VITALS — BP 120/80 | HR 76 | Temp 95.3°F | Wt 161.0 lb

## 2020-01-10 DIAGNOSIS — R109 Unspecified abdominal pain: Secondary | ICD-10-CM

## 2020-01-10 LAB — URINALYSIS, ROUTINE W REFLEX MICROSCOPIC
Bacteria, UA: NONE SEEN /HPF
Bilirubin Urine: NEGATIVE
Glucose, UA: NEGATIVE
Hyaline Cast: NONE SEEN /LPF
Ketones, ur: NEGATIVE
Leukocytes,Ua: NEGATIVE
Nitrite: NEGATIVE
Protein, ur: NEGATIVE
Specific Gravity, Urine: 1.025 (ref 1.001–1.03)
WBC, UA: NONE SEEN /HPF (ref 0–5)
pH: 5.5 (ref 5.0–8.0)

## 2020-01-10 LAB — MICROSCOPIC MESSAGE

## 2020-01-10 NOTE — Progress Notes (Signed)
Subjective:    Patient ID: Alexa Allen, female    DOB: 18-Jul-1956, 64 y.o.   MRN: [id number]  HPI  Patient reports right-sided flank pain.  Pain is been there for about 3 weeks.  She states at times it sharp.  This morning it was painful for her to even touch her skin in that area.  This afternoon as I touch the skin I am unable to elicit any pain.  There is no tenderness to palpation over the ribs and cells.  Patient denies any cough.  She denies any hemoptysis.  She denies any pleurisy.  She denies any shingles-like rash.  On visual inspection of the skin there are no vesicles or blisters or erythema.  There is no spinous process tenderness to palpation.  Levels approximately T8-9 and T10.  The pain is located in the costovertebral angle however she has no CVA tenderness.  She denies any dysuria or hematuria or urgency or frequency.  Movement does not exacerbate the pain.  Rest does not alleviate the pain.  Position changes have no effect.  She denies any fevers or chills.  She denies any nausea or vomiting or diarrhea.  She denies any melena or hematochezia or constipation. Past Medical History:  Diagnosis Date  . Arthritis    hands  . Elevated liver enzymes   . GERD (gastroesophageal reflux disease)   . HSV (herpes simplex virus) infection   . Liver cyst   . SVD (spontaneous vaginal delivery)    x 2  . Wears partial dentures    upper   Past Surgical History:  Procedure Laterality Date  . bladder tack    . COLONOSCOPY     02/2008   . MULTIPLE TOOTH EXTRACTIONS    . TUBAL LIGATION    . VAGINAL HYSTERECTOMY    . WISDOM TOOTH EXTRACTION     Current Outpatient Medications on File Prior to Visit  Medication Sig Dispense Refill  . atorvastatin (LIPITOR) 20 MG tablet Take 1 tablet (20 mg total) by mouth daily. 90 tablet 3  . celecoxib (CELEBREX) 200 MG capsule Take 1 capsule (200 mg total) by mouth 2 (two) times daily. 180 capsule 3  . diclofenac sodium (VOLTAREN) 1 % GEL Apply 2  g topically 4 (four) times daily. 100 g 3  . Multiple Vitamin (MULTIVITAMIN) tablet Take 1 tablet by mouth daily.    Marland Kitchen ofloxacin (FLOXIN) 0.3 % OTIC solution Place 5 drops into the right ear daily for 7 days. 1.8 mL 0  . omeprazole (PRILOSEC) 20 MG capsule Take 20 mg by mouth daily as needed.    . valACYclovir (VALTREX) 500 MG tablet TAKE 1 TABLET DAILY 90 tablet 3   No current facility-administered medications on file prior to visit.   Allergies  Allergen Reactions  . Oxycodone-Acetaminophen Other (See Comments)    Hallucinations and itching  . Iohexol      Code: HIVES, Desc: hives with IV cm- tolerated 1 hour quick prep well, Onset Date: [id number]    Social History   Socioeconomic History  . Marital status: Married    Spouse name: Not on file  . Number of children: 2  . Years of education: Not on file  . Highest education level: Not on file  Occupational History  . Not on file  Tobacco Use  . Smoking status: Former Smoker    Packs/day: 0.25    Types: Cigarettes  . Smokeless tobacco: Never Used  . Tobacco comment: Quit 2011  Substance  and Sexual Activity  . Alcohol use: Yes    Alcohol/week: 1.0 - 2.0 standard drinks    Types: 1 - 2 Cans of beer per week  . Drug use: No  . Sexual activity: Yes    Birth control/protection: Post-menopausal    Comment: Hysterectomy  Other Topics Concern  . Not on file  Social History Narrative  . Not on file   Social Determinants of Health   Financial Resource Strain:   . Difficulty of Paying Living Expenses:   Food Insecurity:   . Worried About Charity fundraiser in the Last Year:   . Arboriculturist in the Last Year:   Transportation Needs:   . Film/video editor (Medical):   Marland Kitchen Lack of Transportation (Non-Medical):   Physical Activity:   . Days of Exercise per Week:   . Minutes of Exercise per Session:   Stress:   . Feeling of Stress :   Social Connections:   . Frequency of Communication with Friends and Family:   .  Frequency of Social Gatherings with Friends and Family:   . Attends Religious Services:   . Active Member of Clubs or Organizations:   . Attends Archivist Meetings:   Marland Kitchen Marital Status:   Intimate Partner Violence:   . Fear of Current or Ex-Partner:   . Emotionally Abused:   Marland Kitchen Physically Abused:   . Sexually Abused:      Review of Systems  All other systems reviewed and are negative.      Objective:   Physical Exam Vitals reviewed.  Constitutional:      General: She is not in acute distress.    Appearance: Normal appearance. She is normal weight. She is not ill-appearing or toxic-appearing.  Cardiovascular:     Rate and Rhythm: Normal rate and regular rhythm.     Heart sounds: Normal heart sounds. No murmur. No friction rub. No gallop.   Pulmonary:     Effort: Pulmonary effort is normal. No respiratory distress.     Breath sounds: Normal breath sounds. No wheezing, rhonchi or rales.    Abdominal:     General: Abdomen is flat. Bowel sounds are normal. There is no distension.     Palpations: Abdomen is soft.     Tenderness: There is no abdominal tenderness. There is no right CVA tenderness, left CVA tenderness, guarding or rebound.  Musculoskeletal:     Right lower leg: No edema.     Left lower leg: No edema.  Neurological:     Mental Status: She is alert.           Assessment & Plan:  Right flank pain - Plan: Urinalysis, Routine w reflex microscopic  Physical exam is normal today.  I suspect a kidney stone.  Other possible differential diagnoses would be intercostal muscle strain, neuropathic thoracic pain from shingles or a pinched nerve in thoracic spine, less likely would be some type of pulmonary pathology.  Urinalysis showed blood.  Pain has been present for 3 weeks.  I suspect a kidney stone and given the duration of pain I will proceed with a CT scan to confirm the presence of a kidney stone and also to assess the size.

## 2020-01-11 LAB — CBC WITH DIFFERENTIAL/PLATELET
Absolute Monocytes: 421 cells/uL (ref 200–950)
Basophils Absolute: 52 cells/uL (ref 0–200)
Basophils Relative: 1 %
Eosinophils Absolute: 120 cells/uL (ref 15–500)
Eosinophils Relative: 2.3 %
HCT: 40.3 % (ref 35.0–45.0)
Hemoglobin: 13.1 g/dL (ref 11.7–15.5)
Lymphs Abs: 1825 cells/uL (ref 850–3900)
MCH: 29.5 pg (ref 27.0–33.0)
MCHC: 32.5 g/dL (ref 32.0–36.0)
MCV: 90.8 fL (ref 80.0–100.0)
MPV: 12.1 fL (ref 7.5–12.5)
Monocytes Relative: 8.1 %
Neutro Abs: 2782 cells/uL (ref 1500–7800)
Neutrophils Relative %: 53.5 %
Platelets: 182 10*3/uL (ref 140–400)
RBC: 4.44 10*6/uL (ref 3.80–5.10)
RDW: 12.8 % (ref 11.0–15.0)
Total Lymphocyte: 35.1 %
WBC: 5.2 10*3/uL (ref 3.8–10.8)

## 2020-01-11 LAB — COMPLETE METABOLIC PANEL WITH GFR
AG Ratio: 1.8 (calc) (ref 1.0–2.5)
ALT: 29 U/L (ref 6–29)
AST: 23 U/L (ref 10–35)
Albumin: 4.4 g/dL (ref 3.6–5.1)
Alkaline phosphatase (APISO): 83 U/L (ref 37–153)
BUN: 19 mg/dL (ref 7–25)
CO2: 28 mmol/L (ref 20–32)
Calcium: 9.6 mg/dL (ref 8.6–10.4)
Chloride: 104 mmol/L (ref 98–110)
Creat: 0.84 mg/dL (ref 0.50–0.99)
GFR, Est African American: 85 mL/min/{1.73_m2} (ref >=60)
GFR, Est Non African American: 73 mL/min/{1.73_m2} (ref >=60)
Globulin: 2.5 g/dL (calc) (ref 1.9–3.7)
Glucose, Bld: 100 mg/dL — ABNORMAL HIGH (ref 65–99)
Potassium: 5 mmol/L (ref 3.5–5.3)
Sodium: 140 mmol/L (ref 135–146)
Total Bilirubin: 0.4 mg/dL (ref 0.2–1.2)
Total Protein: 6.9 g/dL (ref 6.1–8.1)

## 2020-01-25 ENCOUNTER — Other Ambulatory Visit: Payer: Self-pay

## 2020-01-25 ENCOUNTER — Ambulatory Visit
Admission: RE | Admit: 2020-01-25 | Discharge: 2020-01-25 | Disposition: A | Discharge status: Home / Self Care | Payer: BC Managed Care – PPO | Source: Ambulatory Visit | Attending: Family Medicine | Admitting: Family Medicine

## 2020-01-25 DIAGNOSIS — R109 Unspecified abdominal pain: Secondary | ICD-10-CM

## 2020-01-26 ENCOUNTER — Encounter: Payer: Self-pay | Admitting: Family Medicine

## 2020-01-26 DIAGNOSIS — I700 Atherosclerosis of aorta: Secondary | ICD-10-CM | POA: Insufficient documentation

## 2020-01-27 ENCOUNTER — Other Ambulatory Visit: Payer: Self-pay | Admitting: Family Medicine

## 2020-01-27 DIAGNOSIS — N2889 Other specified disorders of kidney and ureter: Secondary | ICD-10-CM

## 2020-01-27 DIAGNOSIS — E785 Hyperlipidemia, unspecified: Secondary | ICD-10-CM

## 2020-02-01 ENCOUNTER — Telehealth: Payer: Self-pay

## 2020-02-01 DIAGNOSIS — N393 Stress incontinence (female) (male): Secondary | ICD-10-CM | POA: Insufficient documentation

## 2020-02-01 DIAGNOSIS — K819 Cholecystitis, unspecified: Secondary | ICD-10-CM | POA: Insufficient documentation

## 2020-02-01 DIAGNOSIS — N8110 Cystocele, unspecified: Secondary | ICD-10-CM | POA: Insufficient documentation

## 2020-02-01 DIAGNOSIS — D259 Leiomyoma of uterus, unspecified: Secondary | ICD-10-CM | POA: Insufficient documentation

## 2020-02-01 DIAGNOSIS — M533 Sacrococcygeal disorders, not elsewhere classified: Secondary | ICD-10-CM | POA: Insufficient documentation

## 2020-02-01 NOTE — Telephone Encounter (Signed)
LMOM on patient's home number that I phoned in her 13-hour prep to her [facility] in [place].  Prednisone 50mg  PO 02/15/20, 13 hours, seven hours and one hour before her appointment (0300, 0900 and 1500), as well as Benadryl 50mg  one hour before her appointment.

## 2020-02-15 ENCOUNTER — Ambulatory Visit
Admission: RE | Admit: 2020-02-15 | Discharge: 2020-02-15 | Disposition: A | Discharge status: Home / Self Care | Payer: BC Managed Care – PPO | Source: Ambulatory Visit | Attending: Family Medicine | Admitting: Family Medicine

## 2020-02-15 DIAGNOSIS — N2889 Other specified disorders of kidney and ureter: Secondary | ICD-10-CM

## 2020-02-15 MED ORDER — IOPAMIDOL (ISOVUE-370) INJECTION 76%
75.0000 mL | Freq: Once | INTRAVENOUS | Status: AC | PRN
  Administered 2020-02-15: 75 mL via INTRAVENOUS

## 2020-02-22 ENCOUNTER — Telehealth: Payer: Self-pay | Admitting: Family Medicine

## 2020-02-22 NOTE — Telephone Encounter (Signed)
CB# [phone] Calling for CT results

## 2020-02-23 NOTE — Telephone Encounter (Signed)
Pt lab results have not been released

## 2020-02-25 ENCOUNTER — Other Ambulatory Visit: Payer: Self-pay

## 2020-02-25 DIAGNOSIS — N281 Cyst of kidney, acquired: Secondary | ICD-10-CM

## 2020-03-01 ENCOUNTER — Telehealth: Payer: Self-pay | Admitting: Urology

## 2020-03-01 NOTE — Telephone Encounter (Signed)
Pt called and stated she did not want to wait until September to be seen. She would like for a nurse to call her back due to see blood in urine.

## 2020-03-01 NOTE — Telephone Encounter (Signed)
Appt given for 8/4 at 8:30. Pt agreed. New address location given to pt as well.

## 2020-03-02 ENCOUNTER — Encounter: Payer: Self-pay | Admitting: *Deleted

## 2020-03-03 DIAGNOSIS — D225 Melanocytic nevi of trunk: Secondary | ICD-10-CM | POA: Diagnosis not present

## 2020-03-03 DIAGNOSIS — L820 Inflamed seborrheic keratosis: Secondary | ICD-10-CM | POA: Diagnosis not present

## 2020-03-03 DIAGNOSIS — L821 Other seborrheic keratosis: Secondary | ICD-10-CM | POA: Diagnosis not present

## 2020-03-03 DIAGNOSIS — L570 Actinic keratosis: Secondary | ICD-10-CM | POA: Diagnosis not present

## 2020-03-03 DIAGNOSIS — Z85828 Personal history of other malignant neoplasm of skin: Secondary | ICD-10-CM | POA: Diagnosis not present

## 2020-03-03 DIAGNOSIS — L814 Other melanin hyperpigmentation: Secondary | ICD-10-CM | POA: Diagnosis not present

## 2020-03-14 ENCOUNTER — Ambulatory Visit: Payer: BC Managed Care – PPO | Admitting: Family Medicine

## 2020-03-14 ENCOUNTER — Other Ambulatory Visit: Payer: Self-pay

## 2020-03-14 VITALS — BP 120/78 | HR 73 | Temp 97.5°F | Ht 62.0 in | Wt 161.0 lb

## 2020-03-14 DIAGNOSIS — N2889 Other specified disorders of kidney and ureter: Secondary | ICD-10-CM

## 2020-03-14 NOTE — Progress Notes (Signed)
Subjective:    Patient ID: Alexa Allen, female    DOB: [birth year], 64 y.o.   MRN: [id number]  HPI Patient is being seen today due to concerns that she has over her imaging reports.  When I last saw the patient she was having right flank pain and I was concerned that she may be having a kidney stone.  Therefore we ordered a CT scan renal stone protocol to evaluate further.  It did not show any kidney stone.  However there was a coincidental finding of a tortuous mass in the upper pole of the left kidney.  This was concerning for a vascular anomaly.  Radiology recommended a CT angiogram to evaluate further.  CT angiogram of the abdomen and pelvis revealed a 1.4 cm exophytic mass on the upper pole of the left kidney that looked like a hyperdense cyst.  Enhancement was over 60 Hounsfield units.  Therefore he was a very dense cyst and not Bosniak class I.  They recommended a renal ultrasound to evaluate further.  Given the frequent test, we have recommended a urology consultation.  Most likely this is a benign cyst however we wanted a second opinion to determine whether this needed to be monitored at all or whether a biopsy was warranted. Past Medical History:  Diagnosis Date  . Arthritis    hands  . Elevated liver enzymes   . GERD (gastroesophageal reflux disease)   . HSV (herpes simplex virus) infection   . Liver cyst   . SVD (spontaneous vaginal delivery)    x 2  . Wears partial dentures    upper   Past Surgical History:  Procedure Laterality Date  . bladder tack    . COLONOSCOPY     02/2008   . MULTIPLE TOOTH EXTRACTIONS    . TUBAL LIGATION    . VAGINAL HYSTERECTOMY    . WISDOM TOOTH EXTRACTION     Current Outpatient Medications on File Prior to Visit  Medication Sig Dispense Refill  . atorvastatin (LIPITOR) 20 MG tablet Take 1 tablet (20 mg total) by mouth daily. 90 tablet 3  . celecoxib (CELEBREX) 200 MG capsule Take 1 capsule (200 mg total) by mouth 2 (two) times daily. 180  capsule 3  . diclofenac sodium (VOLTAREN) 1 % GEL Apply 2 g topically 4 (four) times daily. 100 g 3  . Multiple Vitamin (MULTIVITAMIN) tablet Take 1 tablet by mouth daily.    Marland Kitchen omeprazole (PRILOSEC) 20 MG capsule Take 20 mg by mouth daily as needed.    . valACYclovir (VALTREX) 500 MG tablet TAKE 1 TABLET DAILY 90 tablet 3  . ofloxacin (FLOXIN) 0.3 % OTIC solution Place 5 drops into the right ear daily for 7 days. 1.8 mL 0   No current facility-administered medications on file prior to visit.   Allergies  Allergen Reactions  . Iodinated Diagnostic Agents Hives    (Iohexol) hives with IV cm- tolerated 1 hour quick prep well, Onset Date: [id number]  . Oxycodone-Acetaminophen Other (See Comments)    Hallucinations and itching   Social History   Socioeconomic History  . Marital status: Married    Spouse name: Not on file  . Number of children: 2  . Years of education: Not on file  . Highest education level: Not on file  Occupational History  . Not on file  Tobacco Use  . Smoking status: Former Smoker    Packs/day: 0.25    Types: Cigarettes  . Smokeless tobacco: Never Used  .  Tobacco comment: Quit 2011  Vaping Use  . Vaping Use: Never used  Substance and Sexual Activity  . Alcohol use: Yes    Alcohol/week: 1.0 - 2.0 standard drink    Types: 1 - 2 Cans of beer per week  . Drug use: No  . Sexual activity: Yes    Birth control/protection: Post-menopausal    Comment: Hysterectomy  Other Topics Concern  . Not on file  Social History Narrative  . Not on file   Social Determinants of Health   Financial Resource Strain:   . Difficulty of Paying Living Expenses:   Food Insecurity:   . Worried About Charity fundraiser in the Last Year:   . Arboriculturist in the Last Year:   Transportation Needs:   . Film/video editor (Medical):   Marland Kitchen Lack of Transportation (Non-Medical):   Physical Activity:   . Days of Exercise per Week:   . Minutes of Exercise per Session:   Stress:    . Feeling of Stress :   Social Connections:   . Frequency of Communication with Friends and Family:   . Frequency of Social Gatherings with Friends and Family:   . Attends Religious Services:   . Active Member of Clubs or Organizations:   . Attends Archivist Meetings:   Marland Kitchen Marital Status:   Intimate Partner Violence:   . Fear of Current or Ex-Partner:   . Emotionally Abused:   Marland Kitchen Physically Abused:   . Sexually Abused:       Review of Systems  All other systems reviewed and are negative.      Objective:   Physical Exam   Physical exam was not performed today.  I spent more than 15 minutes with the patient reviewing her films and explaining and answering her questions     Assessment & Plan:  Renal mass  Most likely this lesion on the left kidney represents a hyperdense cyst that is likely benign.  Given the density, I have recommended urology consultation to determine if any further work-up or monitoring is necessary or whether this can simply be ignored as a coincidental benign cyst.  Patient is comfortable with this plan and prefers to talk to the urologist rather than proceed with more imaging

## 2020-03-22 ENCOUNTER — Encounter: Payer: Self-pay | Admitting: Urology

## 2020-03-22 ENCOUNTER — Other Ambulatory Visit: Payer: Self-pay

## 2020-03-22 ENCOUNTER — Ambulatory Visit (INDEPENDENT_AMBULATORY_CARE_PROVIDER_SITE_OTHER): Payer: BC Managed Care – PPO | Admitting: Urology

## 2020-03-22 VITALS — BP 129/77 | HR 73 | Temp 97.8°F | Ht 62.0 in | Wt 161.0 lb

## 2020-03-22 DIAGNOSIS — N281 Cyst of kidney, acquired: Secondary | ICD-10-CM | POA: Diagnosis not present

## 2020-03-22 LAB — URINALYSIS, ROUTINE W REFLEX MICROSCOPIC
Bilirubin, UA: NEGATIVE
Glucose, UA: NEGATIVE
Ketones, UA: NEGATIVE
Leukocytes,UA: NEGATIVE
Nitrite, UA: NEGATIVE
Protein,UA: NEGATIVE
Specific Gravity, UA: <1.005 — ABNORMAL LOW (ref 1.005–1.030)
Urobilinogen, Ur: 0.2 mg/dL (ref 0.2–1.0)
pH, UA: 5.5 (ref 5.0–7.5)

## 2020-03-22 LAB — MICROSCOPIC EXAMINATION
Bacteria, UA: NONE SEEN
Renal Epithel, UA: NONE SEEN /hpf

## 2020-03-22 NOTE — Progress Notes (Signed)
Urological Symptom Review  Patient is experiencing the following symptoms: Blood in urine   Review of Systems  Gastrointestinal (upper)  : Indigestion/heartburn  Gastrointestinal (lower) : Negative for lower GI symptoms  Constitutional : Negative for symptoms  Skin: Negative for skin symptoms  Eyes: Negative for eye symptoms  Ear/Nose/Throat : Negative for Ear/Nose/Throat symptoms  Hematologic/Lymphatic: Negative for Hematologic/Lymphatic symptoms  Cardiovascular : Negative for cardiovascular symptoms  Respiratory : Negative for respiratory symptoms  Endocrine: Negative for endocrine symptoms  Musculoskeletal: Negative for musculoskeletal symptoms  Neurological: Negative for neurological symptoms  Psychologic: Negative for psychiatric symptoms

## 2020-03-22 NOTE — Progress Notes (Signed)
03/22/2020 8:49 AM   Redmond Pulling [birth year] [id number]  Referring provider: Susy Frizzle, MD [address] [place],  [place] [address]  Left renal cyst  HPI: Ms Stockburger is a 64yo here for evaluation of left renal cyst. She was having right flank pain 2 months ago and underwent CT stone study which showed a left renal mass. She underwent CT angio which showed a 1.4cm left hyperdense cyst and multiple liver cysts. UA today shows no RBCs. No family hx of polycystic kidney disease No hx of gross hematuria. No LUTS. No hx of nephrolithiasis  PMH: Past Medical History:  Diagnosis Date  . Arthritis    hands  . Elevated liver enzymes   . GERD (gastroesophageal reflux disease)   . HSV (herpes simplex virus) infection   . Liver cyst   . SVD (spontaneous vaginal delivery)    x 2  . Wears partial dentures    upper    Surgical History: Past Surgical History:  Procedure Laterality Date  . bladder tack    . COLONOSCOPY     02/2008   . MULTIPLE TOOTH EXTRACTIONS    . TUBAL LIGATION    . VAGINAL HYSTERECTOMY    . WISDOM TOOTH EXTRACTION      Home Medications:  Allergies as of 03/22/2020      Reactions   Iodinated Diagnostic Agents Hives   (Iohexol) hives with IV cm- tolerated 1 hour quick prep well, Onset Date: [id number]   Oxycodone-acetaminophen Other (See Comments)   Hallucinations and itching      Medication List       Accurate as of March 22, 2020  8:49 AM. If you have any questions, ask your nurse or doctor.        atorvastatin 20 MG tablet Commonly known as: LIPITOR Take 1 tablet (20 mg total) by mouth daily.   celecoxib 200 MG capsule Commonly known as: CELEBREX Take 1 capsule (200 mg total) by mouth 2 (two) times daily.   diclofenac sodium 1 % Gel Commonly known as: VOLTAREN Apply 2 g topically 4 (four) times daily.   multivitamin tablet Take 1 tablet by mouth daily.   ofloxacin 0.3 % OTIC solution Commonly known as: FLOXIN Place 5  drops into the right ear daily for 7 days.   omeprazole 20 MG capsule Commonly known as: PRILOSEC Take 20 mg by mouth daily as needed.   valACYclovir 500 MG tablet Commonly known as: VALTREX TAKE 1 TABLET DAILY       Allergies:  Allergies  Allergen Reactions  . Iodinated Diagnostic Agents Hives    (Iohexol) hives with IV cm- tolerated 1 hour quick prep well, Onset Date: [id number]  . Oxycodone-Acetaminophen Other (See Comments)    Hallucinations and itching    Family History: Family History  Problem Relation Age of Onset  . Throat cancer Father   . COPD Mother   . Colon cancer Neg Hx   . Rectal cancer Neg Hx   . Stomach cancer Neg Hx     Social History:  reports that she has quit smoking. Her smoking use included cigarettes. She smoked 0.25 packs per day. She has never used smokeless tobacco. She reports current alcohol use of about 1.0 - 2.0 standard drink of alcohol per week. She reports that she does not use drugs.  ROS: All other review of systems were reviewed and are negative except what is noted above in HPI  Physical Exam: BP 129/77   Pulse  73   Temp 97.8 F (36.6 C)   Ht 5\' 2"  (1.575 m)   Wt 161 lb (73 kg)   LMP  (LMP Unknown)   BMI 29.45 kg/m   Constitutional:  Alert and oriented, No acute distress. HEENT: [place] AT, moist mucus membranes.  Trachea midline, no masses. Cardiovascular: No clubbing, cyanosis, or edema. Respiratory: Normal respiratory effort, no increased work of breathing. GI: Abdomen is soft, nontender, nondistended, no abdominal masses GU: No CVA tenderness.  Lymph: No cervical or inguinal lymphadenopathy. Skin: No rashes, bruises or suspicious lesions. Neurologic: Grossly intact, no focal deficits, moving all 4 extremities. Psychiatric: Normal mood and affect.  Laboratory Data: Lab Results  Component Value Date   WBC 5.2 01/10/2020   HGB 13.1 01/10/2020   HCT 40.3 01/10/2020   MCV 90.8 01/10/2020   PLT 182 01/10/2020    Lab  Results  Component Value Date   CREATININE 0.84 01/10/2020    No results found for: PSA  No results found for: TESTOSTERONE  No results found for: HGBA1C  Urinalysis    Component Value Date/Time   COLORURINE YELLOW 01/10/2020 1648   APPEARANCEUR Clear 03/22/2020 0819   LABSPEC 1.025 01/10/2020 1648   PHURINE 5.5 01/10/2020 1648   GLUCOSEU Negative 03/22/2020 0819   HGBUR TRACE (A) 01/10/2020 1648   BILIRUBINUR Negative 03/22/2020 0819   KETONESUR NEGATIVE 01/10/2020 1648   PROTEINUR Negative 03/22/2020 0819   PROTEINUR NEGATIVE 01/10/2020 1648   NITRITE Negative 03/22/2020 0819   NITRITE NEGATIVE 01/10/2020 1648   LEUKOCYTESUR Negative 03/22/2020 0819   LEUKOCYTESUR NEGATIVE 01/10/2020 1648    Lab Results  Component Value Date   LABMICR See below: 03/22/2020   WBCUA 0-5 03/22/2020   LABEPIT 0-10 03/22/2020   BACTERIA None seen 03/22/2020    Pertinent Imaging: CT angio 02/15/2020: Images reviewed and discussed with the patient No results found for this or any previous visit.  No results found for this or any previous visit.  No results found for this or any previous visit.  No results found for this or any previous visit.  No results found for this or any previous visit.  No results found for this or any previous visit.  No results found for this or any previous visit.  Results for orders placed during the hospital encounter of 01/25/20  CT RENAL STONE STUDY  Narrative CLINICAL DATA:  Right flank pain, concern for nephrolithiasis  EXAM: CT ABDOMEN AND PELVIS WITHOUT CONTRAST  TECHNIQUE: Multidetector CT imaging of the abdomen and pelvis was performed following the standard protocol without IV contrast.  COMPARISON:  None.  FINDINGS: Lower chest: Minor basilar atelectasis versus scarring. Heart size. No pericardial or pleural effusion. Degenerative changes of the thoracic spine.  Hepatobiliary: Right hepatic hypodensities measure up to 2.4  cm, suspect small hepatic cysts with low Hounsfield units. No biliary dilatation or obstruction. Gallbladder unremarkable and nondistended. Common bile duct nondilated.  Pancreas: Unremarkable. No pancreatic ductal dilatation or surrounding inflammatory changes.  Spleen: Normal in size without focal abnormality.  Adrenals/Urinary Tract: Normal adrenal glands. No renal obstruction or hydronephrosis. No hydroureter or obstructing ureteral calculus. Bladder unremarkable.  Along the upper pole of the left kidney medially, there indeterminate hyperdense tortuous areas noted along the upper hilar region, images 24, 25 series 2. On the coronal images, these appear to represent venous structures and may be dilated veins. Difficult to exclude underlying left renal vascular abnormality. Consider follow-up evaluation with a abdomen CTA. No evidence of acute perinephric or  retroperitoneal hemorrhage.  Stomach/Bowel: Negative for bowel obstruction, significant dilatation, ileus, or free air. Normal appearing appendix.  No acute inflammatory process, fluid collection, hemorrhage, hematoma, abscess, or ascites.  Vascular/Lymphatic: Aorta atherosclerotic. Negative for aneurysm. Limited assessment without IV contrast. No bulky adenopathy.  Reproductive: No significant finding by CT. Remote hysterectomy. No adnexal abnormality. No pelvic free fluid.  Other: No abdominal wall hernia or abnormality. No abdominopelvic ascites.  Musculoskeletal: Degenerative changes noted of the spine.  IMPRESSION: No acute obstructing urinary tract calculus, hydronephrosis, or obstructive uropathy.  Left kidney upper pole tortuous vascular structures appearing to communicate with the left renal vein. Limited assessment without IV contrast. Small renal vascular abnormality not excluded. Consider follow-up nonemergent CTA. No evidence of acute retroperitoneal or perinephric hemorrhage.  Suspect small  hepatic cysts  Aortic atherosclerosis  These results will be called to the ordering clinician or representative by the Radiologist Assistant, and communication documented in the PACS or [employer].   Electronically Signed By: Jerilynn Mages.  Shick M.D. On: 01/26/2020 14:27   Assessment & Plan:    1. Kidney cysts -We discussed the benign nature of the renal cysts. Since she has liver cysts also I will see her back in 1 year with a renal US due to increased risk of polycystic kindey - Urinalysis, Routine w reflex microscopic   No follow-ups on file.  Nicolette Bang, MD  [facility] [place]

## 2020-03-22 NOTE — Patient Instructions (Signed)
Renal Mass  A renal mass is a growth in the kidney. A renal mass may be found while performing an MRI, CT scan, or ultrasound for other problems of the abdomen. Certain types of cancers, infections, or injuries can cause a renal mass. A renal mass that is cancerous (malignant) may grow or spread quickly. Others are harmless (benign). What are common types of renal masses? Renal masses include:  Tumors. These may be cancerous (malignant) or noncancerous (benign). ? The most common type of kidney cancer is renal cell carcinoma. ? The most common benign tumors of the kidney include renal adenomas, oncocytomas, and angiomyolipoma (AML).  Cysts. These are fluid-filled sacs that form on or in the kidney. ? It is not always known what causes a cyst to develop in or on the kidney. ? Most kidney cysts do not cause symptoms and do not need to be treated. What type of testing might I need? Your health care provider may recommend that you have tests to diagnose the cause of your renal mass. The following tests may be done if a renal mass is found:  Physical exam.  Blood tests.  Urine tests.  Imaging tests, such as ultrasound, CT scan, or MRI.  Biopsy. This is a small sample that is removed from the renal mass and tested in a lab. The exact tests and how often they are done will depend on:  The size and appearance of the renal mass.  Risk factors or medical conditions that increase your risk for problems.  Any symptoms associated with the renal mass, or concerns that you have about it. Tests and physical exams may be done once, or they may be done regularly for a period of time. Tests and exams that are done regularly will help monitor whether the mass is growing and beginning to cause problems. What are common treatments for renal masses? Treatment is not always needed for this condition. Your health care provider may recommend careful monitoring (watchful waiting) and regular tests and exams.  Treatment will depend on the cause of the mass. Follow these instructions at home: What you need to do at home will depend on the cause of the mass. Follow the instructions that your health care provider gives to you. In general:  Take over-the-counter and prescription medicines only as told by your health care provider.  If you are prescribed an antibiotic medicine, take it as told by your health care provider. Do not stop taking the antibiotic even if you start to feel better.  Follow any restrictions that are given to you by your health care provider.  Keep all follow-up visits as told by your health care provider. This is important. ? You may need to see your health care provider once or twice a year to have CT scans and ultrasounds done. These tests will show if your renal mass has changed or grown bigger. Contact a health care provider if you:  Have pain in the side or back (flank pain).  Have a fever.  Feel full soon after eating.  Have pain or swelling in the abdomen.  Lose weight. Get help right away if:  Your pain gets worse.  There is blood in your urine.  You cannot urinate.  You have chest pain.  You have trouble breathing. Summary  A renal mass is a growth in the kidney. It may be cancerous (malignant) and grow or spread quickly, or it may be harmless (benign).  Renal masses may be found while performing   an MRI, CT scan, or ultrasound for other problems of the abdomen.  Your health care provider may recommend that you have tests to diagnose the cause of your renal mass. This may include a physical exam, blood tests, urine tests, imaging, or a biopsy.  Treatment is not always needed for this condition. Careful monitoring (watchful waiting) may be recommended. This information is not intended to replace advice given to you by your health care provider. Make sure you discuss any questions you have with your health care provider. Document Revised: 09/11/2017  Document Reviewed: 09/11/2017 Elsevier Patient Education  2020 [employer] Inc[employer]  

## 2020-04-19 ENCOUNTER — Ambulatory Visit: Payer: BC Managed Care – PPO | Admitting: Urology

## 2020-07-06 ENCOUNTER — Other Ambulatory Visit: Payer: Self-pay

## 2020-07-06 ENCOUNTER — Ambulatory Visit (INDEPENDENT_AMBULATORY_CARE_PROVIDER_SITE_OTHER): Payer: BC Managed Care – PPO | Admitting: Family Medicine

## 2020-07-06 ENCOUNTER — Encounter: Payer: BC Managed Care – PPO | Admitting: Family Medicine

## 2020-07-06 VITALS — BP 110/70 | HR 80 | Temp 97.9°F | Ht 62.0 in | Wt 161.0 lb

## 2020-07-06 DIAGNOSIS — Z23 Encounter for immunization: Secondary | ICD-10-CM

## 2020-07-06 DIAGNOSIS — Z0000 Encounter for general adult medical examination without abnormal findings: Secondary | ICD-10-CM | POA: Diagnosis not present

## 2020-07-06 NOTE — Progress Notes (Signed)
Subjective:    Patient ID: Alexa Allen, female    DOB: 1955-10-07, 64 y.o.   MRN: [id number]  HPI Patient is a very pleasant 64 y/o WF here for CPE.  Last colonoscopy was 2019. She is planning on retiring next year at 49 primarily due to osteoarthritis in both hands. She states that her hands hurt her on a daily basis despite taking Celebrex. Rheumatoid test last year was negative, sed rate was negative, and exam is more consistent with osteoarthritis. She had a colonoscopy as mentioned above in 2019 that did show one tubular adenoma. She is due for repeat colonoscopy therefore in 2024. She had a hysterectomy and therefore does not require a Pap smear. She is due for mammogram. She is due for a bone density test next year. She had her cholesterol recently checked at her work. Her HDL cholesterol was 79. Her total cholesterol was 140. Her blood sugar was normal. We checked a CMP and a CBC this summer that were both normal. Therefore I do not feel that she is due for any lab work. She is due for a flu shot today. She is already had her Covid shot. Her shingles shot is up-to-date. She is due for a tetanus shot but she politely declines that mmunization records are below: Immunization History  Administered Date(s) Administered  . Influenza,inj,Quad PF,6+ Mos 05/18/2014, 05/15/2016  . Influenza-Unspecified 05/22/2018, 06/22/2019  . Zoster 05/16/2016    Past Medical History:  Diagnosis Date  . Arthritis    hands  . Elevated liver enzymes   . GERD (gastroesophageal reflux disease)   . HSV (herpes simplex virus) infection   . Liver cyst   . SVD (spontaneous vaginal delivery)    x 2  . Wears partial dentures    upper   Past Surgical History:  Procedure Laterality Date  . bladder tack    . COLONOSCOPY     02/2008   . MULTIPLE TOOTH EXTRACTIONS    . TUBAL LIGATION    . VAGINAL HYSTERECTOMY    . WISDOM TOOTH EXTRACTION     Current Outpatient Medications on File Prior to Visit    Medication Sig Dispense Refill  . atorvastatin (LIPITOR) 20 MG tablet Take 1 tablet (20 mg total) by mouth daily. 90 tablet 3  . celecoxib (CELEBREX) 200 MG capsule Take 1 capsule (200 mg total) by mouth 2 (two) times daily. 180 capsule 3  . diclofenac sodium (VOLTAREN) 1 % GEL Apply 2 g topically 4 (four) times daily. 100 g 3  . Multiple Vitamin (MULTIVITAMIN) tablet Take 1 tablet by mouth daily.    Marland Kitchen omeprazole (PRILOSEC) 20 MG capsule Take 20 mg by mouth daily as needed.    . valACYclovir (VALTREX) 500 MG tablet TAKE 1 TABLET DAILY 90 tablet 3  . ofloxacin (FLOXIN) 0.3 % OTIC solution Place 5 drops into the right ear daily for 7 days. 1.8 mL 0   No current facility-administered medications on file prior to visit.   Allergies  Allergen Reactions  . Iodinated Diagnostic Agents Hives    (Iohexol) hives with IV cm- tolerated 1 hour quick prep well, Onset Date: [id number]  . Oxycodone-Acetaminophen Other (See Comments)    Hallucinations and itching   Social History   Socioeconomic History  . Marital status: Married    Spouse name: Not on file  . Number of children: 2  . Years of education: Not on file  . Highest education level: Not on file  Occupational History  .  Occupation: Public relations account executive  Tobacco Use  . Smoking status: Former Smoker    Packs/day: 0.25    Types: Cigarettes  . Smokeless tobacco: Never Used  . Tobacco comment: Quit 2011  Vaping Use  . Vaping Use: Never used  Substance and Sexual Activity  . Alcohol use: Yes    Alcohol/week: 1.0 - 2.0 standard drink    Types: 1 - 2 Cans of beer per week  . Drug use: No  . Sexual activity: Yes    Birth control/protection: Post-menopausal    Comment: Hysterectomy  Other Topics Concern  . Not on file  Social History Narrative  . Not on file   Social Determinants of Health   Financial Resource Strain:   . Difficulty of Paying Living Expenses: Not on file  Food Insecurity:   . Worried About Charity fundraiser in the  Last Year: Not on file  . Ran Out of Food in the Last Year: Not on file  Transportation Needs:   . Lack of Transportation (Medical): Not on file  . Lack of Transportation (Non-Medical): Not on file  Physical Activity:   . Days of Exercise per Week: Not on file  . Minutes of Exercise per Session: Not on file  Stress:   . Feeling of Stress : Not on file  Social Connections:   . Frequency of Communication with Friends and Family: Not on file  . Frequency of Social Gatherings with Friends and Family: Not on file  . Attends Religious Services: Not on file  . Active Member of Clubs or Organizations: Not on file  . Attends Archivist Meetings: Not on file  . Marital Status: Not on file  Intimate Partner Violence:   . Fear of Current or Ex-Partner: Not on file  . Emotionally Abused: Not on file  . Physically Abused: Not on file  . Sexually Abused: Not on file     Review of Systems  All other systems reviewed and are negative.      Objective:   Physical Exam Vitals reviewed.  Constitutional:      General: She is not in acute distress.    Appearance: She is well-developed. She is not diaphoretic.  HENT:     Head: Normocephalic and atraumatic.     Right Ear: External ear normal.     Left Ear: External ear normal.     Nose: Nose normal.     Mouth/Throat:     Pharynx: No oropharyngeal exudate.  Eyes:     General: No scleral icterus.       Right eye: No discharge.        Left eye: No discharge.     Conjunctiva/sclera: Conjunctivae normal.     Pupils: Pupils are equal, round, and reactive to light.  Neck:     Thyroid: No thyromegaly.     Vascular: No JVD.     Trachea: No tracheal deviation.  Cardiovascular:     Rate and Rhythm: Normal rate and regular rhythm.     Heart sounds: Normal heart sounds. No murmur heard.  No friction rub. No gallop.   Pulmonary:     Effort: Pulmonary effort is normal. No respiratory distress.     Breath sounds: Normal breath sounds. No  stridor. No wheezing or rales.  Chest:     Chest wall: No tenderness.  Abdominal:     General: Bowel sounds are normal. There is no distension.     Palpations: Abdomen is soft. There is  no mass.     Tenderness: There is no abdominal tenderness. There is no guarding or rebound.  Musculoskeletal:        General: No tenderness. Normal range of motion.     Cervical back: Normal range of motion and neck supple.  Lymphadenopathy:     Cervical: No cervical adenopathy.  Skin:    General: Skin is warm.     Coloration: Skin is not pale.     Findings: No erythema or rash.  Neurological:     Mental Status: She is alert and oriented to person, place, and time.     Cranial Nerves: No cranial nerve deficit.     Motor: No abnormal muscle tone.     Coordination: Coordination normal.     Deep Tendon Reflexes: Reflexes are normal and symmetric.  Psychiatric:        Behavior: Behavior normal.        Thought Content: Thought content normal.        Judgment: Judgment normal.         Assessment & Plan:  General medical exam  Physical exam today is normal. Recommended a mammogram but she prefers to schedule this with her gynecologist. Pap smear is not due and colonoscopy is up-to-date. Lab work has been checked sporadically throughout this year and has been normal. Recommended at 1200 mg a day of calcium and 1000 units a day of vitamin D. Recommended a tetanus shot. Recommended a flu shot.

## 2020-08-28 DIAGNOSIS — D485 Neoplasm of uncertain behavior of skin: Secondary | ICD-10-CM | POA: Diagnosis not present

## 2020-08-28 DIAGNOSIS — B078 Other viral warts: Secondary | ICD-10-CM | POA: Diagnosis not present

## 2020-08-28 DIAGNOSIS — L820 Inflamed seborrheic keratosis: Secondary | ICD-10-CM | POA: Diagnosis not present

## 2020-08-28 DIAGNOSIS — L570 Actinic keratosis: Secondary | ICD-10-CM | POA: Diagnosis not present

## 2020-08-28 DIAGNOSIS — L821 Other seborrheic keratosis: Secondary | ICD-10-CM | POA: Diagnosis not present

## 2020-08-31 DIAGNOSIS — J029 Acute pharyngitis, unspecified: Secondary | ICD-10-CM | POA: Diagnosis not present

## 2020-08-31 DIAGNOSIS — R059 Cough, unspecified: Secondary | ICD-10-CM | POA: Diagnosis not present

## 2020-08-31 DIAGNOSIS — U071 COVID-19: Secondary | ICD-10-CM | POA: Diagnosis not present

## 2020-08-31 DIAGNOSIS — R52 Pain, unspecified: Secondary | ICD-10-CM | POA: Diagnosis not present

## 2020-10-23 ENCOUNTER — Other Ambulatory Visit: Payer: Self-pay | Admitting: Family Medicine

## 2020-12-11 ENCOUNTER — Encounter: Payer: Self-pay | Admitting: Family Medicine

## 2020-12-11 ENCOUNTER — Ambulatory Visit: Payer: BC Managed Care – PPO | Admitting: Family Medicine

## 2020-12-11 ENCOUNTER — Other Ambulatory Visit: Payer: Self-pay

## 2020-12-11 VITALS — BP 122/60 | HR 68 | Temp 98.1°F | Resp 16 | Ht 62.0 in | Wt 162.0 lb

## 2020-12-11 DIAGNOSIS — S060X1A Concussion with loss of consciousness of 30 minutes or less, initial encounter: Secondary | ICD-10-CM

## 2020-12-11 DIAGNOSIS — S0012XA Contusion of left eyelid and periocular area, initial encounter: Secondary | ICD-10-CM | POA: Diagnosis not present

## 2020-12-11 NOTE — Progress Notes (Signed)
Subjective:    Patient ID: Alexa Allen, female    DOB: 10/03/1955, 65 y.o.   MRN: [id number]  HPI     Saturday, the patient was helped clearing brush around her house.  She was carrying leaves across the road to be burn when she tripped on the road and fell landing directly on the left side of her face.  She was wearing glasses at the time and the metal rim struck above her left eye.  She was knocked unconscious when she landed on the ground for just a brief second.  She came to immediately.  She was suffering a nosebleed from her left nostril.  She immediately developed bruising around her left eyebrow and left cheek.  She denies any headaches.  She denies any dizziness.  She denies any double vision or blurry vision.  She denies any eye pain although she does have some tenderness and swelling over her left eyebrow as shown in the photograph.  Cranial nerves II through XII are grossly intact.  Muscle strength is 5/5 equal and symmetric in the upper and lower extremities.  She has extraocular movements intact.  Pupils are equal round reactive to light. Past Medical History:  Diagnosis Date  . Arthritis    hands  . Elevated liver enzymes   . GERD (gastroesophageal reflux disease)   . HSV (herpes simplex virus) infection   . Liver cyst   . SVD (spontaneous vaginal delivery)    x 2  . Wears partial dentures    upper   Past Surgical History:  Procedure Laterality Date  . bladder tack    . COLONOSCOPY     02/2008   . MULTIPLE TOOTH EXTRACTIONS    . TUBAL LIGATION    . VAGINAL HYSTERECTOMY    . WISDOM TOOTH EXTRACTION     Current Outpatient Medications on File Prior to Visit  Medication Sig Dispense Refill  . atorvastatin (LIPITOR) 20 MG tablet Take 1 tablet (20 mg total) by mouth daily. 90 tablet 3  . Multiple Vitamin (MULTIVITAMIN) tablet Take 1 tablet by mouth daily.    Marland Kitchen omeprazole (PRILOSEC) 20 MG capsule Take 20 mg by mouth daily as needed.    . valACYclovir (VALTREX) 500  MG tablet TAKE 1 TABLET DAILY 90 tablet 3   No current facility-administered medications on file prior to visit.   Allergies  Allergen Reactions  . Iodinated Diagnostic Agents Hives    (Iohexol) hives with IV cm- tolerated 1 hour quick prep well, Onset Date: [id number]  . Oxycodone-Acetaminophen Other (See Comments)    Hallucinations and itching   Social History   Socioeconomic History  . Marital status: Married    Spouse name: Not on file  . Number of children: 2  . Years of education: Not on file  . Highest education level: Not on file  Occupational History  . Occupation: Public relations account executive  Tobacco Use  . Smoking status: Former Smoker    Packs/day: 0.25    Types: Cigarettes  . Smokeless tobacco: Never Used  . Tobacco comment: Quit 2011  Vaping Use  . Vaping Use: Never used  Substance and Sexual Activity  . Alcohol use: Yes    Alcohol/week: 1.0 - 2.0 standard drink    Types: 1 - 2 Cans of beer per week  . Drug use: No  . Sexual activity: Yes    Birth control/protection: Post-menopausal    Comment: Hysterectomy  Other Topics Concern  . Not on file  Social History  Narrative  . Not on file   Social Determinants of Health   Financial Resource Strain: Not on file  Food Insecurity: Not on file  Transportation Needs: Not on file  Physical Activity: Not on file  Stress: Not on file  Social Connections: Not on file  Intimate Partner Violence: Not on file     Review of Systems  All other systems reviewed and are negative.      Objective:   Physical Exam Vitals reviewed.  Constitutional:      General: She is not in acute distress.    Appearance: Normal appearance. She is normal weight. She is not ill-appearing or toxic-appearing.  HENT:     Head: Contusion present. No left periorbital erythema.     Jaw: There is normal jaw occlusion.   Eyes:     General: Lids are everted, no foreign bodies appreciated. Vision grossly intact. Gaze aligned appropriately.         Right eye: No foreign body.        Left eye: No foreign body.     Extraocular Movements: Extraocular movements intact.     Right eye: Normal extraocular motion.     Left eye: Normal extraocular motion.     Conjunctiva/sclera:     Right eye: Right conjunctiva is not injected. No chemosis, exudate or hemorrhage.    Left eye: Left conjunctiva is not injected. No chemosis, exudate or hemorrhage. Cardiovascular:     Rate and Rhythm: Normal rate and regular rhythm.     Heart sounds: Normal heart sounds. No murmur heard. No friction rub. No gallop.   Pulmonary:     Effort: Pulmonary effort is normal. No respiratory distress.     Breath sounds: Normal breath sounds. No wheezing, rhonchi or rales.  Abdominal:     General: Abdomen is flat. Bowel sounds are normal. There is no distension.     Palpations: Abdomen is soft.     Tenderness: There is no abdominal tenderness. There is no right CVA tenderness, left CVA tenderness, guarding or rebound.  Musculoskeletal:     Right lower leg: No edema.     Left lower leg: No edema.  Neurological:     Mental Status: She is alert.           Assessment & Plan:  Concussion with loss of consciousness of 30 minutes or less, initial encounter  Black eye of left side, initial encounter  Patient suffered a concussion.  There is no residual neurologic deficit.  She denies any headache or dizziness or nausea or vomiting.  She does have some tenderness and swelling over her left eyebrow but otherwise her exam is normal aside from the bruising.  Recommended she apply ice for swelling.  She can also take ibuprofen for inflammation.  Reassess if swelling does not gradually improve over the next 7 to 10 days

## 2020-12-12 DIAGNOSIS — L820 Inflamed seborrheic keratosis: Secondary | ICD-10-CM | POA: Diagnosis not present

## 2021-01-11 DIAGNOSIS — H43819 Vitreous degeneration, unspecified eye: Secondary | ICD-10-CM | POA: Diagnosis not present

## 2021-01-11 DIAGNOSIS — H5213 Myopia, bilateral: Secondary | ICD-10-CM | POA: Diagnosis not present

## 2021-01-11 DIAGNOSIS — H43399 Other vitreous opacities, unspecified eye: Secondary | ICD-10-CM | POA: Diagnosis not present

## 2021-01-11 DIAGNOSIS — H524 Presbyopia: Secondary | ICD-10-CM | POA: Diagnosis not present

## 2021-01-11 DIAGNOSIS — H52223 Regular astigmatism, bilateral: Secondary | ICD-10-CM | POA: Diagnosis not present

## 2021-02-21 ENCOUNTER — Telehealth: Payer: Self-pay | Admitting: *Deleted

## 2021-02-21 NOTE — Telephone Encounter (Signed)
Received call from patient.   Reports that she is concerned that she has a sinus infection. Sx include productive cough with green thick mucus, thick nasal drainage, sinus pressure, HA.  Advised to continue symptom management with OTC medications: Tylenol/ Ibuprofen for fever/ body aches, Mucinex/ Delsym for cough/ chest congestion, Afrin/Sudafed/nasal saline for sinus pressure/ nasal congestion.  Advised that if Sx worsen or persist x1 week, contact office for appointment.   Patient states that she would like ABTx for sinus infection. Advised that OV will be required prior to ABTx. States that she is currently out of town on vacation. Advised that she can go to [facility] for evaluation.   Requested PCP to advise.

## 2021-02-22 MED ORDER — AMOXICILLIN 875 MG PO TABS: 875.0000 mg | ORAL_TABLET | Freq: Two times a day (BID) | ORAL | 0 refills | Status: AC

## 2021-02-22 NOTE — Telephone Encounter (Signed)
Call placed to patient and patient made aware.   Prescription sent to pharmacy per patient request.

## 2021-02-22 NOTE — Addendum Note (Signed)
Addended by: Sheral Flow on: 02/22/2021 12:34 PM   Modules accepted: Orders

## 2021-03-21 ENCOUNTER — Ambulatory Visit: Payer: BC Managed Care – PPO | Admitting: Urology

## 2021-03-22 ENCOUNTER — Ambulatory Visit: Payer: BC Managed Care – PPO | Admitting: Urology

## 2021-03-23 ENCOUNTER — Other Ambulatory Visit: Payer: Self-pay

## 2021-03-23 ENCOUNTER — Other Ambulatory Visit: Payer: Self-pay | Admitting: *Deleted

## 2021-03-23 ENCOUNTER — Ambulatory Visit (INDEPENDENT_AMBULATORY_CARE_PROVIDER_SITE_OTHER): Payer: Medicare HMO | Admitting: Family Medicine

## 2021-03-23 DIAGNOSIS — L989 Disorder of the skin and subcutaneous tissue, unspecified: Secondary | ICD-10-CM | POA: Diagnosis not present

## 2021-03-23 DIAGNOSIS — L570 Actinic keratosis: Secondary | ICD-10-CM

## 2021-03-23 MED ORDER — OMEPRAZOLE 20 MG PO CPDR: 20.0000 mg | DELAYED_RELEASE_CAPSULE | Freq: Every day | ORAL | 3 refills | Status: DC

## 2021-03-23 MED ORDER — ATORVASTATIN CALCIUM 20 MG PO TABS: 20.0000 mg | ORAL_TABLET | Freq: Every day | ORAL | 3 refills | Status: DC

## 2021-03-23 MED ORDER — VALACYCLOVIR HCL 500 MG PO TABS: 500.0000 mg | ORAL_TABLET | Freq: Every day | ORAL | 3 refills | Status: DC

## 2021-03-23 NOTE — Progress Notes (Signed)
Subjective:    Patient ID: Alexa Allen, female    DOB: Apr 22, 1956, 65 y.o.   MRN: [id number]  HPI Patient is a very pleasant 65 year old Caucasian female who has a lesion on her right anterior shin and.  She states that its been growing over the last month.  It is 1 cm in diameter.  Is a slightly brown slightly violaceous papule.  It is a dome-shaped papule.  The surrounding skin is slightly red.  The patient states that it was a "mole" that she aggravated shaving however it continues to grow and change and she would feel better to have a biopsy to have it removed. Past Medical History:  Diagnosis Date   Arthritis    hands   Elevated liver enzymes    GERD (gastroesophageal reflux disease)    HSV (herpes simplex virus) infection    Liver cyst    SVD (spontaneous vaginal delivery)    x 2   Wears partial dentures    upper   Past Surgical History:  Procedure Laterality Date   bladder tack     COLONOSCOPY     02/2008    MULTIPLE TOOTH EXTRACTIONS     TUBAL LIGATION     VAGINAL HYSTERECTOMY     WISDOM TOOTH EXTRACTION     Current Outpatient Medications on File Prior to Visit  Medication Sig Dispense Refill   atorvastatin (LIPITOR) 20 MG tablet Take 1 tablet (20 mg total) by mouth daily. 90 tablet 3   Multiple Vitamin (MULTIVITAMIN) tablet Take 1 tablet by mouth daily.     omeprazole (PRILOSEC) 20 MG capsule Take 20 mg by mouth daily as needed.     valACYclovir (VALTREX) 500 MG tablet TAKE 1 TABLET DAILY 90 tablet 3   No current facility-administered medications on file prior to visit.   Allergies  Allergen Reactions   Iodinated Diagnostic Agents Hives    (Iohexol) hives with IV cm- tolerated 1 hour quick prep well, Onset Date: [id number]   Oxycodone-Acetaminophen Other (See Comments)    Hallucinations and itching   Social History   Socioeconomic History   Marital status: Married    Spouse name: Not on file   Number of children: 2   Years of education: Not on file    Highest education level: Not on file  Occupational History   Occupation: Public relations account executive  Tobacco Use   Smoking status: Former    Packs/day: 0.25    Types: Cigarettes   Smokeless tobacco: Never   Tobacco comments:    Quit 2011  Vaping Use   Vaping Use: Never used  Substance and Sexual Activity   Alcohol use: Yes    Alcohol/week: 1.0 - 2.0 standard drink    Types: 1 - 2 Cans of beer per week   Drug use: No   Sexual activity: Yes    Birth control/protection: Post-menopausal    Comment: Hysterectomy  Other Topics Concern   Not on file  Social History Narrative   Not on file   Social Determinants of Health   Financial Resource Strain: Not on file  Food Insecurity: Not on file  Transportation Needs: Not on file  Physical Activity: Not on file  Stress: Not on file  Social Connections: Not on file  Intimate Partner Violence: Not on file      Review of Systems  All other systems reviewed and are negative.     Objective:   Physical Exam Vitals reviewed.  Cardiovascular:     Rate  and Rhythm: Normal rate and regular rhythm.     Pulses: Normal pulses.     Heart sounds: Normal heart sounds.  Pulmonary:     Effort: Pulmonary effort is normal.     Breath sounds: Normal breath sounds.  Musculoskeletal:       Legs:         Assessment & Plan:  Skin lesion of right leg - Plan: Pathology Report ([facility]) I believe this is likely an inflamed seborrheic keratosis.  The area was cleaned and prepped in sterile fashion and anesthetized with 0.1% lidocaine with epinephrine.  Shave biopsy was performed and the lesion was sent to pathology in a labeled container.  Hemostasis was achieved with Drysol and a Band-Aid.  Patient tolerated the procedure well without complication.

## 2021-03-27 ENCOUNTER — Telehealth: Payer: Self-pay

## 2021-03-27 LAB — PATHOLOGY REPORT

## 2021-03-27 LAB — TISSUE SPECIMEN

## 2021-03-29 ENCOUNTER — Other Ambulatory Visit: Payer: Self-pay | Admitting: Family Medicine

## 2021-03-29 MED ORDER — ACYCLOVIR 400 MG PO TABS: 400.0000 mg | ORAL_TABLET | Freq: Every day | ORAL | 3 refills | Status: DC

## 2021-03-29 NOTE — Telephone Encounter (Signed)
Noted  

## 2021-04-17 ENCOUNTER — Other Ambulatory Visit: Payer: Self-pay | Admitting: *Deleted

## 2021-04-17 MED ORDER — VALACYCLOVIR HCL 500 MG PO TABS: 500.0000 mg | ORAL_TABLET | Freq: Every day | ORAL | 3 refills | Status: DC

## 2021-05-30 ENCOUNTER — Other Ambulatory Visit: Payer: Self-pay

## 2021-05-30 ENCOUNTER — Ambulatory Visit (INDEPENDENT_AMBULATORY_CARE_PROVIDER_SITE_OTHER): Payer: Medicare HMO | Admitting: *Deleted

## 2021-05-30 DIAGNOSIS — Z23 Encounter for immunization: Secondary | ICD-10-CM

## 2021-07-17 DIAGNOSIS — Z809 Family history of malignant neoplasm, unspecified: Secondary | ICD-10-CM | POA: Diagnosis not present

## 2021-07-17 DIAGNOSIS — I739 Peripheral vascular disease, unspecified: Secondary | ICD-10-CM | POA: Diagnosis not present

## 2021-07-17 DIAGNOSIS — Z008 Encounter for other general examination: Secondary | ICD-10-CM | POA: Diagnosis not present

## 2021-07-17 DIAGNOSIS — Z87891 Personal history of nicotine dependence: Secondary | ICD-10-CM | POA: Diagnosis not present

## 2021-07-17 DIAGNOSIS — Z825 Family history of asthma and other chronic lower respiratory diseases: Secondary | ICD-10-CM | POA: Diagnosis not present

## 2021-07-17 DIAGNOSIS — Z91041 Radiographic dye allergy status: Secondary | ICD-10-CM | POA: Diagnosis not present

## 2021-07-17 DIAGNOSIS — B009 Herpesviral infection, unspecified: Secondary | ICD-10-CM | POA: Diagnosis not present

## 2021-07-17 DIAGNOSIS — K219 Gastro-esophageal reflux disease without esophagitis: Secondary | ICD-10-CM | POA: Diagnosis not present

## 2021-07-17 DIAGNOSIS — E785 Hyperlipidemia, unspecified: Secondary | ICD-10-CM | POA: Diagnosis not present

## 2021-07-17 DIAGNOSIS — Z7722 Contact with and (suspected) exposure to environmental tobacco smoke (acute) (chronic): Secondary | ICD-10-CM | POA: Diagnosis not present

## 2021-08-16 ENCOUNTER — Ambulatory Visit (INDEPENDENT_AMBULATORY_CARE_PROVIDER_SITE_OTHER): Payer: Medicare HMO | Admitting: Family Medicine

## 2021-08-16 ENCOUNTER — Other Ambulatory Visit: Payer: Self-pay

## 2021-08-16 ENCOUNTER — Encounter: Payer: Self-pay | Admitting: Family Medicine

## 2021-08-16 VITALS — BP 122/68 | HR 82 | Temp 97.4°F | Resp 18 | Ht 62.0 in | Wt 162.0 lb

## 2021-08-16 DIAGNOSIS — R42 Dizziness and giddiness: Secondary | ICD-10-CM | POA: Diagnosis not present

## 2021-08-16 NOTE — Progress Notes (Signed)
Subjective:    Patient ID: Alexa Allen, female    DOB: 10/17/1955, 65 y.o.   MRN: [id number]  HPI Patient states that for the last 2 weeks, she has been experiencing dizziness whenever she sits up or lays down.  Turning her head side to side does not seem to trigger the dizziness.  The dizziness does sound like vertigo as the room spins.  The symptoms gradually improve over 30 minutes and then subside.  She denies any unilateral hearing loss or severe headache or tinnitus.  Both eardrums and auditory canals look completely normal today on exam her neurologic exam is normal.  Patient is concerned because she did suffer a severe concussion back in the spring of this year. Past Medical History:  Diagnosis Date   Arthritis    hands   Elevated liver enzymes    GERD (gastroesophageal reflux disease)    HSV (herpes simplex virus) infection    Liver cyst    SVD (spontaneous vaginal delivery)    x 2   Wears partial dentures    upper   Past Surgical History:  Procedure Laterality Date   bladder tack     COLONOSCOPY     02/2008    MULTIPLE TOOTH EXTRACTIONS     TUBAL LIGATION     VAGINAL HYSTERECTOMY     WISDOM TOOTH EXTRACTION     Current Outpatient Medications on File Prior to Visit  Medication Sig Dispense Refill   atorvastatin (LIPITOR) 20 MG tablet Take 1 tablet (20 mg total) by mouth daily. 90 tablet 3   Multiple Vitamin (MULTIVITAMIN) tablet Take 1 tablet by mouth daily.     omeprazole (PRILOSEC) 20 MG capsule Take 1 capsule (20 mg total) by mouth daily. 90 capsule 3   valACYclovir (VALTREX) 500 MG tablet Take 1 tablet (500 mg total) by mouth daily. 90 tablet 3   No current facility-administered medications on file prior to visit.   Allergies  Allergen Reactions   Iodinated Contrast Media Hives    (Iohexol) hives with IV cm- tolerated 1 hour quick prep well, Onset Date: [id number]   Oxycodone-Acetaminophen Other (See Comments)    Hallucinations and itching   Social  History   Socioeconomic History   Marital status: Married    Spouse name: Not on file   Number of children: 2   Years of education: Not on file   Highest education level: Not on file  Occupational History   Occupation: Public relations account executive  Tobacco Use   Smoking status: Former    Packs/day: 0.25    Types: Cigarettes   Smokeless tobacco: Never   Tobacco comments:    Quit 2011  Vaping Use   Vaping Use: Never used  Substance and Sexual Activity   Alcohol use: Yes    Alcohol/week: 1.0 - 2.0 standard drink    Types: 1 - 2 Cans of beer per week   Drug use: No   Sexual activity: Yes    Birth control/protection: Post-menopausal    Comment: Hysterectomy  Other Topics Concern   Not on file  Social History Narrative   Not on file   Social Determinants of Health   Financial Resource Strain: Not on file  Food Insecurity: Not on file  Transportation Needs: Not on file  Physical Activity: Not on file  Stress: Not on file  Social Connections: Not on file  Intimate Partner Violence: Not on file     Review of Systems  All other systems reviewed and are  negative.     Objective:   Physical Exam Vitals reviewed.  Constitutional:      General: She is not in acute distress.    Appearance: Normal appearance. She is normal weight. She is not ill-appearing or toxic-appearing.  HENT:     Head: Normocephalic and atraumatic. No left periorbital erythema.     Jaw: There is normal jaw occlusion.     Right Ear: Tympanic membrane and ear canal normal.     Left Ear: Tympanic membrane and ear canal normal.  Eyes:     General: Lids are everted, no foreign bodies appreciated. Vision grossly intact. Gaze aligned appropriately.        Right eye: No foreign body.        Left eye: No foreign body.     Extraocular Movements: Extraocular movements intact.     Right eye: Normal extraocular motion.     Left eye: Normal extraocular motion.     Conjunctiva/sclera:     Right eye: Right conjunctiva is  not injected. No chemosis, exudate or hemorrhage.    Left eye: Left conjunctiva is not injected. No chemosis, exudate or hemorrhage.    Pupils: Pupils are equal, round, and reactive to light.  Cardiovascular:     Rate and Rhythm: Normal rate and regular rhythm.     Heart sounds: Normal heart sounds. No murmur heard.   No friction rub. No gallop.  Pulmonary:     Effort: Pulmonary effort is normal. No respiratory distress.     Breath sounds: Normal breath sounds. No wheezing, rhonchi or rales.  Musculoskeletal:     Right lower leg: No edema.     Left lower leg: No edema.  Neurological:     General: No focal deficit present.     Mental Status: She is alert and oriented to person, place, and time. Mental status is at baseline.     Cranial Nerves: No cranial nerve deficit.     Motor: No weakness.     Gait: Gait normal.          Assessment & Plan:  Vertigo Symptoms sound like vertigo.  Recommended tincture of time as vertigo usually subsides over a period of 3 to 4 weeks.  Recommended meclizine as needed for vertigo if severe.  If headaches develop or hearing loss develops or symptoms worsen I would recommend imaging of the brain to try to reassure the patient that her symptoms sound like BPPV

## 2021-08-30 DIAGNOSIS — L814 Other melanin hyperpigmentation: Secondary | ICD-10-CM | POA: Diagnosis not present

## 2021-08-30 DIAGNOSIS — D485 Neoplasm of uncertain behavior of skin: Secondary | ICD-10-CM | POA: Diagnosis not present

## 2021-08-30 DIAGNOSIS — D1801 Hemangioma of skin and subcutaneous tissue: Secondary | ICD-10-CM | POA: Diagnosis not present

## 2021-08-30 DIAGNOSIS — L821 Other seborrheic keratosis: Secondary | ICD-10-CM | POA: Diagnosis not present

## 2021-08-30 DIAGNOSIS — L570 Actinic keratosis: Secondary | ICD-10-CM | POA: Diagnosis not present

## 2021-08-30 DIAGNOSIS — Z85828 Personal history of other malignant neoplasm of skin: Secondary | ICD-10-CM | POA: Diagnosis not present

## 2021-10-04 ENCOUNTER — Other Ambulatory Visit: Payer: Self-pay

## 2021-10-04 ENCOUNTER — Ambulatory Visit (INDEPENDENT_AMBULATORY_CARE_PROVIDER_SITE_OTHER): Payer: Medicare HMO | Admitting: Family Medicine

## 2021-10-04 ENCOUNTER — Encounter: Payer: Self-pay | Admitting: Family Medicine

## 2021-10-04 VITALS — BP 122/78 | HR 76 | Temp 97.2°F | Resp 18 | Ht 62.0 in | Wt 165.0 lb

## 2021-10-04 DIAGNOSIS — C4492 Squamous cell carcinoma of skin, unspecified: Secondary | ICD-10-CM | POA: Diagnosis not present

## 2021-10-04 DIAGNOSIS — D485 Neoplasm of uncertain behavior of skin: Secondary | ICD-10-CM

## 2021-10-04 DIAGNOSIS — C44722 Squamous cell carcinoma of skin of right lower limb, including hip: Secondary | ICD-10-CM | POA: Diagnosis not present

## 2021-10-04 DIAGNOSIS — L989 Disorder of the skin and subcutaneous tissue, unspecified: Secondary | ICD-10-CM

## 2021-10-04 NOTE — Progress Notes (Signed)
Subjective:    Patient ID: Alexa Allen, female    DOB: [birth year], 66 y.o.   MRN: [id number]  HPI Patient has 2 lesions she is concerned about.  1 is a skin tag located on her left flank below her bra line.  It is an irritated wart like papule 2 to 3 mm in diameter.  He gets irritated by her bra strap and she would like it removed.  The second lesion is a dome-shaped papule on her right medial calf.  It is 6 mm in diameter.  It has telangiectasias and is extremely tender to the touch.  I am not certain as to what it is but I am concerned about malignancy  Past Medical History:  Diagnosis Date   Arthritis    hands   Elevated liver enzymes    GERD (gastroesophageal reflux disease)    HSV (herpes simplex virus) infection    Liver cyst    SVD (spontaneous vaginal delivery)    x 2   Wears partial dentures    upper   Past Surgical History:  Procedure Laterality Date   bladder tack     COLONOSCOPY     02/2008    MULTIPLE TOOTH EXTRACTIONS     TUBAL LIGATION     VAGINAL HYSTERECTOMY     WISDOM TOOTH EXTRACTION     Current Outpatient Medications on File Prior to Visit  Medication Sig Dispense Refill   atorvastatin (LIPITOR) 20 MG tablet Take 1 tablet (20 mg total) by mouth daily. 90 tablet 3   Multiple Vitamin (MULTIVITAMIN) tablet Take 1 tablet by mouth daily.     omeprazole (PRILOSEC) 20 MG capsule Take 1 capsule (20 mg total) by mouth daily. 90 capsule 3   valACYclovir (VALTREX) 500 MG tablet Take 1 tablet (500 mg total) by mouth daily. 90 tablet 3   No current facility-administered medications on file prior to visit.   Allergies  Allergen Reactions   Iodinated Contrast Media Hives    (Iohexol) hives with IV cm- tolerated 1 hour quick prep well, Onset Date: [id number]   Oxycodone-Acetaminophen Other (See Comments)    Hallucinations and itching   Social History   Socioeconomic History   Marital status: Married    Spouse name: Not on file   Number of children: 2    Years of education: Not on file   Highest education level: Not on file  Occupational History   Occupation: Public relations account executive  Tobacco Use   Smoking status: Former    Packs/day: 0.25    Types: Cigarettes   Smokeless tobacco: Never   Tobacco comments:    Quit 2011  Vaping Use   Vaping Use: Never used  Substance and Sexual Activity   Alcohol use: Yes    Alcohol/week: 1.0 - 2.0 standard drink    Types: 1 - 2 Cans of beer per week   Drug use: No   Sexual activity: Yes    Birth control/protection: Post-menopausal    Comment: Hysterectomy  Other Topics Concern   Not on file  Social History Narrative   Not on file   Social Determinants of Health   Financial Resource Strain: Not on file  Food Insecurity: Not on file  Transportation Needs: Not on file  Physical Activity: Not on file  Stress: Not on file  Social Connections: Not on file  Intimate Partner Violence: Not on file     Review of Systems  All other systems reviewed and are negative.  Objective:   Physical Exam Vitals reviewed.  Constitutional:      General: She is not in acute distress.    Appearance: Normal appearance. She is normal weight. She is not ill-appearing or toxic-appearing.  HENT:     Head: No left periorbital erythema.     Jaw: There is normal jaw occlusion.  Eyes:     General: Lids are everted, no foreign bodies appreciated. Vision grossly intact. Gaze aligned appropriately.        Right eye: No foreign body.        Left eye: No foreign body.     Extraocular Movements:     Right eye: Normal extraocular motion.     Left eye: Normal extraocular motion.     Conjunctiva/sclera:     Right eye: Right conjunctiva is not injected. No chemosis, exudate or hemorrhage.    Left eye: Left conjunctiva is not injected. No chemosis, exudate or hemorrhage. Cardiovascular:     Rate and Rhythm: Normal rate and regular rhythm.     Heart sounds: Normal heart sounds. No murmur heard.   No friction rub. No  gallop.  Pulmonary:     Effort: Pulmonary effort is normal. No respiratory distress.     Breath sounds: Normal breath sounds. No wheezing, rhonchi or rales.    Musculoskeletal:       Legs:  Neurological:     Mental Status: She is alert.          Assessment & Plan:  Leg skin lesion, right - Plan: Pathology Report ([facility]) Patient has an irritated skin tag on her left flank.  I anesthetized this lesion with 0.1% lidocaine with epinephrine and removed it using a shave biopsy technique.  Hemostasis was achieved with Drysol and a Band-Aid.  I am concerned that the lesion on her right leg may be a basal cell carcinoma.  I anesthetized the entire lesion with 0.1% lidocaine with epinephrine and then removed the entire lesion with a shave biopsy technique and sent the lesion to pathology in a labeled container.  I achieved hemostasis with Drysol and a Band-Aid.  Await the results of the pathology report

## 2021-10-08 LAB — PATHOLOGY REPORT

## 2021-10-08 LAB — TISSUE SPECIMEN

## 2021-10-15 ENCOUNTER — Ambulatory Visit (INDEPENDENT_AMBULATORY_CARE_PROVIDER_SITE_OTHER): Payer: Medicare HMO | Admitting: Family Medicine

## 2021-10-15 ENCOUNTER — Other Ambulatory Visit: Payer: Self-pay

## 2021-10-15 VITALS — BP 124/88 | HR 74 | Temp 96.6°F | Resp 18 | Ht 62.0 in | Wt 165.0 lb

## 2021-10-15 DIAGNOSIS — C4492 Squamous cell carcinoma of skin, unspecified: Secondary | ICD-10-CM | POA: Diagnosis not present

## 2021-10-15 DIAGNOSIS — D0471 Carcinoma in situ of skin of right lower limb, including hip: Secondary | ICD-10-CM

## 2021-10-15 NOTE — Progress Notes (Signed)
Subjective:    Patient ID: Alexa Allen, female    DOB: 03-26-1956, 66 y.o.   MRN: [id number]  HPI 10/04/21 Patient has 2 lesions she is concerned about.  1 is a skin tag located on her left flank below her bra line.  It is an irritated wart like papule 2 to 3 mm in diameter.  He gets irritated by her bra strap and she would like it removed.  The second lesion is a dome-shaped papule on her right medial calf.  It is 6 mm in diameter.  It has telangiectasias and is extremely tender to the touch.  I am not certain as to what it is but I am concerned about malignancy At that time, my plan was: Patient has an irritated skin tag on her left flank.  I anesthetized this lesion with 0.1% lidocaine with epinephrine and removed it using a shave biopsy technique.  Hemostasis was achieved with Drysol and a Band-Aid.  I am concerned that the lesion on her right leg may be a basal cell carcinoma.  I anesthetized the entire lesion with 0.1% lidocaine with epinephrine and then removed the entire lesion with a shave biopsy technique and sent the lesion to pathology in a labeled container.  I achieved hemostasis with Drysol and a Band-Aid.  Await the results of the pathology report  10/15/21 Biopsy revealed invasive squamous cell carcinoma.  The deep margins were involved.  Patient is here today for an excisional biopsy. Past Medical History:  Diagnosis Date   Arthritis    hands   Elevated liver enzymes    GERD (gastroesophageal reflux disease)    HSV (herpes simplex virus) infection    Liver cyst    SVD (spontaneous vaginal delivery)    x 2   Wears partial dentures    upper   Past Surgical History:  Procedure Laterality Date   bladder tack     COLONOSCOPY     02/2008    MULTIPLE TOOTH EXTRACTIONS     TUBAL LIGATION     VAGINAL HYSTERECTOMY     WISDOM TOOTH EXTRACTION     Current Outpatient Medications on File Prior to Visit  Medication Sig Dispense Refill   acyclovir (ZOVIRAX) 400 MG tablet  Take 400 mg by mouth daily.     atorvastatin (LIPITOR) 20 MG tablet Take 1 tablet (20 mg total) by mouth daily. 90 tablet 3   Multiple Vitamin (MULTIVITAMIN) tablet Take 1 tablet by mouth daily.     omeprazole (PRILOSEC) 20 MG capsule Take 1 capsule (20 mg total) by mouth daily. 90 capsule 3   No current facility-administered medications on file prior to visit.   Allergies  Allergen Reactions   Iodinated Contrast Media Hives    (Iohexol) hives with IV cm- tolerated 1 hour quick prep well, Onset Date: [id number]   Oxycodone-Acetaminophen Other (See Comments)    Hallucinations and itching   Social History   Socioeconomic History   Marital status: Married    Spouse name: Not on file   Number of children: 2   Years of education: Not on file   Highest education level: Not on file  Occupational History   Occupation: Public relations account executive  Tobacco Use   Smoking status: Former    Packs/day: 0.25    Types: Cigarettes   Smokeless tobacco: Never   Tobacco comments:    Quit 2011  Vaping Use   Vaping Use: Never used  Substance and Sexual Activity   Alcohol use: Yes  Alcohol/week: 1.0 - 2.0 standard drink    Types: 1 - 2 Cans of beer per week   Drug use: No   Sexual activity: Yes    Birth control/protection: Post-menopausal    Comment: Hysterectomy  Other Topics Concern   Not on file  Social History Narrative   Not on file   Social Determinants of Health   Financial Resource Strain: Not on file  Food Insecurity: Not on file  Transportation Needs: Not on file  Physical Activity: Not on file  Stress: Not on file  Social Connections: Not on file  Intimate Partner Violence: Not on file     Review of Systems  All other systems reviewed and are negative.     Objective:   Physical Exam Vitals reviewed.  Constitutional:      General: She is not in acute distress.    Appearance: Normal appearance. She is normal weight. She is not ill-appearing or toxic-appearing.  HENT:      Head: No left periorbital erythema.     Jaw: There is normal jaw occlusion.  Eyes:     General: Lids are everted, no foreign bodies appreciated. Vision grossly intact. Gaze aligned appropriately.        Right eye: No foreign body.        Left eye: No foreign body.     Extraocular Movements:     Right eye: Normal extraocular motion.     Left eye: Normal extraocular motion.     Conjunctiva/sclera:     Right eye: Right conjunctiva is not injected. No chemosis, exudate or hemorrhage.    Left eye: Left conjunctiva is not injected. No chemosis, exudate or hemorrhage. Cardiovascular:     Rate and Rhythm: Normal rate and regular rhythm.     Heart sounds: Normal heart sounds. No murmur heard.   No friction rub. No gallop.  Pulmonary:     Effort: Pulmonary effort is normal. No respiratory distress.     Breath sounds: Normal breath sounds. No wheezing, rhonchi or rales.    Musculoskeletal:       Legs:  Neurological:     Mental Status: She is alert.          Assessment & Plan:  Squamous cell carcinoma in situ (SCCIS) of skin of right lower leg - Plan: Pathology Report ([facility]) Area was anesthetized with 0.1% lidocaine with epinephrine.  The area was prepped and draped in sterile fashion.  Using a scalpel, the entire lesion was excised down to the subcutaneous fat.  A 1.5 x 1.5 cm elliptical excision was performed.  Skin edges were approximated using 5 simple interrupted 3-0 Ethilon sutures.  Patient tolerated procedure well without any complication.  Lesion was sent to pathology and labeled container.  There was no blood loss.

## 2021-10-17 LAB — PATHOLOGY REPORT

## 2021-10-17 LAB — TISSUE SPECIMEN

## 2021-10-24 ENCOUNTER — Ambulatory Visit (INDEPENDENT_AMBULATORY_CARE_PROVIDER_SITE_OTHER): Payer: Medicare HMO | Admitting: Family Medicine

## 2021-10-24 ENCOUNTER — Other Ambulatory Visit: Payer: Self-pay

## 2021-10-24 ENCOUNTER — Encounter: Payer: Self-pay | Admitting: Family Medicine

## 2021-10-24 VITALS — BP 122/78 | HR 74 | Temp 96.3°F | Resp 18 | Ht 62.0 in | Wt 165.0 lb

## 2021-10-24 DIAGNOSIS — Z4802 Encounter for removal of sutures: Secondary | ICD-10-CM

## 2021-10-24 NOTE — Progress Notes (Signed)
? ?Subjective:  ? ? Patient ID: Alexa Allen, female    DOB: 1955/09/05, 66 y.o.   MRN: [id number] ? ?Suture / Staple Removal ? ?On February 27, I removed a squamous cell carcinoma that was invasive from her right posterior medial calf.  The biopsy showed clear margins.  She is here today for suture removal.  The wound itself is clean dry and intact with no evidence of secondary cellulitis or pain.  There is a foreign body reaction to the sutures ?Past Medical History:  ?Diagnosis Date  ? Arthritis   ? hands  ? Elevated liver enzymes   ? GERD (gastroesophageal reflux disease)   ? HSV (herpes simplex virus) infection   ? Liver cyst   ? SVD (spontaneous vaginal delivery)   ? x 2  ? Wears partial dentures   ? upper  ? ?Past Surgical History:  ?Procedure Laterality Date  ? bladder tack    ? COLONOSCOPY    ? 02/2008   ? MULTIPLE TOOTH EXTRACTIONS    ? TUBAL LIGATION    ? VAGINAL HYSTERECTOMY    ? WISDOM TOOTH EXTRACTION    ? ?Current Outpatient Medications on File Prior to Visit  ?Medication Sig Dispense Refill  ? acyclovir (ZOVIRAX) 400 MG tablet Take 400 mg by mouth daily.    ? atorvastatin (LIPITOR) 20 MG tablet Take 1 tablet (20 mg total) by mouth daily. 90 tablet 3  ? Multiple Vitamin (MULTIVITAMIN) tablet Take 1 tablet by mouth daily.    ? omeprazole (PRILOSEC) 20 MG capsule Take 1 capsule (20 mg total) by mouth daily. 90 capsule 3  ? ?No current facility-administered medications on file prior to visit.  ? ?Allergies  ?Allergen Reactions  ? Iodinated Contrast Media Hives  ?  (Iohexol) hives with IV cm- tolerated 1 hour quick prep well, Onset Date: [id number]  ? Oxycodone-Acetaminophen Other (See Comments)  ?  Hallucinations and itching  ? ?Social History  ? ?Socioeconomic History  ? Marital status: Married  ?  Spouse name: Not on file  ? Number of children: 2  ? Years of education: Not on file  ? Highest education level: Not on file  ?Occupational History  ? Occupation: Public relations account executive  ?Tobacco Use  ? Smoking  status: Former  ?  Packs/day: 0.25  ?  Types: Cigarettes  ? Smokeless tobacco: Never  ? Tobacco comments:  ?  Quit 2011  ?Vaping Use  ? Vaping Use: Never used  ?Substance and Sexual Activity  ? Alcohol use: Yes  ?  Alcohol/week: 1.0 - 2.0 standard drink  ?  Types: 1 - 2 Cans of beer per week  ? Drug use: No  ? Sexual activity: Yes  ?  Birth control/protection: Post-menopausal  ?  Comment: Hysterectomy  ?Other Topics Concern  ? Not on file  ?Social History Narrative  ? Not on file  ? ?Social Determinants of Health  ? ?Financial Resource Strain: Not on file  ?Food Insecurity: Not on file  ?Transportation Needs: Not on file  ?Physical Activity: Not on file  ?Stress: Not on file  ?Social Connections: Not on file  ?Intimate Partner Violence: Not on file  ? ? ? ?Review of Systems  ?All other systems reviewed and are negative. ? ?   ?Objective:  ? Physical Exam ?Vitals reviewed.  ?Constitutional:   ?   General: She is not in acute distress. ?   Appearance: Normal appearance. She is normal weight. She is not ill-appearing or toxic-appearing.  ?HENT:  ?  Head: No left periorbital erythema.  ?   Jaw: There is normal jaw occlusion.  ?Eyes:  ?   General: Lids are everted, no foreign bodies appreciated. Vision grossly intact. Gaze aligned appropriately.     ?   Right eye: No foreign body.     ?   Left eye: No foreign body.  ?   Extraocular Movements:  ?   Right eye: Normal extraocular motion.  ?   Left eye: Normal extraocular motion.  ?   Conjunctiva/sclera:  ?   Right eye: Right conjunctiva is not injected. No chemosis, exudate or hemorrhage. ?   Left eye: Left conjunctiva is not injected. No chemosis, exudate or hemorrhage. ?Cardiovascular:  ?   Rate and Rhythm: Normal rate and regular rhythm.  ?   Heart sounds: Normal heart sounds. No murmur heard. ?  No friction rub. No gallop.  ?Pulmonary:  ?   Effort: Pulmonary effort is normal. No respiratory distress.  ?   Breath sounds: Normal breath sounds. No wheezing, rhonchi or  rales.  ?Musculoskeletal:  ?     Legs: ? ?Neurological:  ?   Mental Status: She is alert.  ? ? ? ? ? ?   ?Assessment & Plan:  ?Visit for suture removal ?I removed 5 sutures without difficulty.  I reinforced the wound with Steri-Strips.  Follow-up as needed.  Wound care was discussed. ?

## 2021-12-25 ENCOUNTER — Other Ambulatory Visit: Payer: Self-pay | Admitting: Family Medicine

## 2021-12-25 DIAGNOSIS — Z1231 Encounter for screening mammogram for malignant neoplasm of breast: Secondary | ICD-10-CM

## 2021-12-27 ENCOUNTER — Telehealth: Payer: Self-pay | Admitting: Family Medicine

## 2021-12-27 NOTE — Telephone Encounter (Signed)
Left message for patient to call back and schedule Medicare Annual Wellness Visit (AWV) in office.  ° °If not able to come in office, please offer to do virtually or by telephone.  Left office number and my jabber #[phone]. ° °Due for AWVI ° °Please schedule at anytime with Nurse Health Advisor. °  °

## 2022-01-04 ENCOUNTER — Ambulatory Visit
Admission: RE | Admit: 2022-01-04 | Discharge: 2022-01-04 | Disposition: A | Discharge status: Home / Self Care | Payer: Self-pay | Source: Ambulatory Visit | Attending: Family Medicine | Admitting: Family Medicine

## 2022-01-04 DIAGNOSIS — Z1231 Encounter for screening mammogram for malignant neoplasm of breast: Secondary | ICD-10-CM

## 2022-02-07 ENCOUNTER — Ambulatory Visit (INDEPENDENT_AMBULATORY_CARE_PROVIDER_SITE_OTHER): Payer: BC Managed Care – PPO

## 2022-02-07 VITALS — Ht 62.0 in | Wt 162.0 lb

## 2022-02-07 DIAGNOSIS — Z780 Asymptomatic menopausal state: Secondary | ICD-10-CM

## 2022-02-07 DIAGNOSIS — Z0000 Encounter for general adult medical examination without abnormal findings: Secondary | ICD-10-CM | POA: Diagnosis not present

## 2022-02-07 NOTE — Patient Instructions (Signed)
Alexa Allen , Thank you for taking time to come for your Medicare Wellness Visit. I appreciate your ongoing commitment to your health goals. Please review the following plan we discussed and let me know if I can assist you in the future.   Screening recommendations/referrals: Colonoscopy: Done 06/19/2018 Repeat in 5 years  Mammogram: Done 01/04/2022. Repeat annually  Bone Density: Order placed today.   Recommended yearly ophthalmology/optometry visit for glaucoma screening and checkup Recommended yearly dental visit for hygiene and checkup  Vaccinations: Influenza vaccine: Done 05/30/2021 Repeat annually  Pneumococcal vaccine: Due after age 75, Prevnar-65. Tdap vaccine: Due every 10 years. Shingles vaccine: Done 05/16/2016. Discussed Shingrix, available at your pharmacy.   Covid-19:done 04/19/2020 and 05/10/2020.  Advanced directives: Advance directive discussed with you today. Even though you declined this today, please call our office should you change your mind, and we can give you the proper paperwork for you to fill out.   Conditions/risks identified: Aim for 30 minutes of exercise or brisk walking, 6-8 glasses of water, and 5 servings of fruits and vegetables each day. KEEP UP THE GOOD WORK!!   Next appointment: Follow up in one year for your annual wellness visit 2024.   Preventive Care 66 Years and Older, Female Preventive care refers to lifestyle choices and visits with your health care provider that can promote health and wellness. What does preventive care include? A yearly physical exam. This is also called an annual well check. Dental exams once or twice a year. Routine eye exams. Ask your health care provider how often you should have your eyes checked. Personal lifestyle choices, including: Daily care of your teeth and gums. Regular physical activity. Eating a healthy diet. Avoiding tobacco and drug use. Limiting alcohol use. Practicing safe sex. Taking low-dose  aspirin every day. Taking vitamin and mineral supplements as recommended by your health care provider. What happens during an annual well check? The services and screenings done by your health care provider during your annual well check will depend on your age, overall health, lifestyle risk factors, and family history of disease. Counseling  Your health care provider may ask you questions about your: Alcohol use. Tobacco use. Drug use. Emotional well-being. Home and relationship well-being. Sexual activity. Eating habits. History of falls. Memory and ability to understand (cognition). Work and work Statistician. Reproductive health. Screening  You may have the following tests or measurements: Height, weight, and BMI. Blood pressure. Lipid and cholesterol levels. These may be checked every 5 years, or more frequently if you are over 63 years old. Skin check. Lung cancer screening. You may have this screening every year starting at age 20 if you have a 30-pack-year history of smoking and currently smoke or have quit within the past 15 years. Fecal occult blood test (FOBT) of the stool. You may have this test every year starting at age 1. Flexible sigmoidoscopy or colonoscopy. You may have a sigmoidoscopy every 5 years or a colonoscopy every 10 years starting at age 60. Hepatitis C blood test. Hepatitis B blood test. Sexually transmitted disease (STD) testing. Diabetes screening. This is done by checking your blood sugar (glucose) after you have not eaten for a while (fasting). You may have this done every 1-3 years. Bone density scan. This is done to screen for osteoporosis. You may have this done starting at age 26. Mammogram. This may be done every 1-2 years. Talk to your health care provider about how often you should have regular mammograms. Talk with your health  care provider about your test results, treatment options, and if necessary, the need for more tests. Vaccines  Your  health care provider may recommend certain vaccines, such as: Influenza vaccine. This is recommended every year. Tetanus, diphtheria, and acellular pertussis (Tdap, Td) vaccine. You may need a Td booster every 10 years. Zoster vaccine. You may need this after age 28. Pneumococcal 13-valent conjugate (PCV13) vaccine. One dose is recommended after age 69. Pneumococcal polysaccharide (PPSV23) vaccine. One dose is recommended after age 66. Talk to your health care provider about which screenings and vaccines you need and how often you need them. This information is not intended to replace advice given to you by your health care provider. Make sure you discuss any questions you have with your health care provider. Document Released: 09/01/2015 Document Revised: 04/24/2016 Document Reviewed: 06/06/2015 Elsevier Interactive Patient Education  2017 [place] Prevention in the Home Falls can cause injuries. They can happen to people of all ages. There are many things you can do to make your home safe and to help prevent falls. What can I do on the outside of my home? Regularly fix the edges of walkways and driveways and fix any cracks. Remove anything that might make you trip as you walk through a door, such as a raised step or threshold. Trim any bushes or trees on the path to your home. Use bright outdoor lighting. Clear any walking paths of anything that might make someone trip, such as rocks or tools. Regularly check to see if handrails are loose or broken. Make sure that both sides of any steps have handrails. Any raised decks and porches should have guardrails on the edges. Have any leaves, snow, or ice cleared regularly. Use sand or salt on walking paths during winter. Clean up any spills in your garage right away. This includes oil or grease spills. What can I do in the bathroom? Use night lights. Install grab bars by the toilet and in the tub and shower. Do not use towel bars as  grab bars. Use non-skid mats or decals in the tub or shower. If you need to sit down in the shower, use a plastic, non-slip stool. Keep the floor dry. Clean up any water that spills on the floor as soon as it happens. Remove soap buildup in the tub or shower regularly. Attach bath mats securely with double-sided non-slip rug tape. Do not have throw rugs and other things on the floor that can make you trip. What can I do in the bedroom? Use night lights. Make sure that you have a light by your bed that is easy to reach. Do not use any sheets or blankets that are too big for your bed. They should not hang down onto the floor. Have a firm chair that has side arms. You can use this for support while you get dressed. Do not have throw rugs and other things on the floor that can make you trip. What can I do in the kitchen? Clean up any spills right away. Avoid walking on wet floors. Keep items that you use a lot in easy-to-reach places. If you need to reach something above you, use a strong step stool that has a grab bar. Keep electrical cords out of the way. Do not use floor polish or wax that makes floors slippery. If you must use wax, use non-skid floor wax. Do not have throw rugs and other things on the floor that can make you trip. What can  I do with my stairs? Do not leave any items on the stairs. Make sure that there are handrails on both sides of the stairs and use them. Fix handrails that are broken or loose. Make sure that handrails are as long as the stairways. Check any carpeting to make sure that it is firmly attached to the stairs. Fix any carpet that is loose or worn. Avoid having throw rugs at the top or bottom of the stairs. If you do have throw rugs, attach them to the floor with carpet tape. Make sure that you have a light switch at the top of the stairs and the bottom of the stairs. If you do not have them, ask someone to add them for you. What else can I do to help prevent  falls? Wear shoes that: Do not have high heels. Have rubber bottoms. Are comfortable and fit you well. Are closed at the toe. Do not wear sandals. If you use a stepladder: Make sure that it is fully opened. Do not climb a closed stepladder. Make sure that both sides of the stepladder are locked into place. Ask someone to hold it for you, if possible. Clearly mark and make sure that you can see: Any grab bars or handrails. First and last steps. Where the edge of each step is. Use tools that help you move around (mobility aids) if they are needed. These include: Canes. Walkers. Scooters. Crutches. Turn on the lights when you go into a dark area. Replace any light bulbs as soon as they burn out. Set up your furniture so you have a clear path. Avoid moving your furniture around. If any of your floors are uneven, fix them. If there are any pets around you, be aware of where they are. Review your medicines with your doctor. Some medicines can make you feel dizzy. This can increase your chance of falling. Ask your doctor what other things that you can do to help prevent falls. This information is not intended to replace advice given to you by your health care provider. Make sure you discuss any questions you have with your health care provider. Document Released: 06/01/2009 Document Revised: 01/11/2016 Document Reviewed: 09/09/2014 Elsevier Interactive Patient Education  2017 [employer].

## 2022-02-07 NOTE — Progress Notes (Cosign Needed)
Subjective:   Alexa Allen is a 66 y.o. female who presents for an Initial Medicare Annual Wellness Visit. Virtual Visit via Telephone Note  I connected with  Redmond Pulling on 02/07/22 at  9:15 AM EDT by telephone and verified that I am speaking with the correct person using two identifiers.  Location: Patient: HOME Provider: [facility] Persons participating in the virtual visit: patient/Nurse Health Advisor   I discussed the limitations, risks, security and privacy concerns of performing an evaluation and management service by telephone and the availability of in person appointments. The patient expressed understanding and agreed to proceed.  Interactive audio and video telecommunications were attempted between this nurse and patient, however failed, due to patient having technical difficulties OR patient did not have access to video capability.  We continued and completed visit with audio only.  Some vital signs may be absent or patient reported.   Chriss Driver, LPN  Review of Systems     Cardiac Risk Factors include: advanced age (>68mn, >>58women);sedentary lifestyle;dyslipidemia     Objective:    Today's Vitals   02/07/22 0917  Weight: 162 lb (73.5 kg)  Height: '5\' 2"'$  (1.575 m)   Body mass index is 29.63 kg/m.     02/07/2022    9:27 AM  Advanced Directives  Does Patient Have a Medical Advance Directive? No  Would patient like information on creating a medical advance directive? No - Patient declined    Current Medications (verified) Outpatient Encounter Medications as of 02/07/2022  Medication Sig   acyclovir (ZOVIRAX) 400 MG tablet Take 400 mg by mouth daily.   atorvastatin (LIPITOR) 20 MG tablet Take 1 tablet (20 mg total) by mouth daily.   Multiple Vitamin (MULTIVITAMIN) tablet Take 1 tablet by mouth daily.   omeprazole (PRILOSEC) 20 MG capsule Take 1 capsule (20 mg total) by mouth daily.   No facility-administered encounter medications on file as  of 02/07/2022.    Allergies (verified) Iodinated contrast media and Oxycodone-acetaminophen   History: Past Medical History:  Diagnosis Date   Arthritis    hands   Elevated liver enzymes    GERD (gastroesophageal reflux disease)    HSV (herpes simplex virus) infection    Liver cyst    SVD (spontaneous vaginal delivery)    x 2   Wears partial dentures    upper   Past Surgical History:  Procedure Laterality Date   bladder tack     COLONOSCOPY     02/2008    MULTIPLE TOOTH EXTRACTIONS     TUBAL LIGATION     VAGINAL HYSTERECTOMY     WISDOM TOOTH EXTRACTION     Family History  Problem Relation Age of Onset   Throat cancer Father    COPD Mother    Colon cancer Neg Hx    Rectal cancer Neg Hx    Stomach cancer Neg Hx    Social History   Socioeconomic History   Marital status: Married    Spouse name: Not on file   Number of children: 2   Years of education: Not on file   Highest education level: Not on file  Occupational History   Occupation: pPublic relations account executive Tobacco Use   Smoking status: Former    Packs/day: 0.25    Types: Cigarettes   Smokeless tobacco: Never   Tobacco comments:    Quit 2011  Vaping Use   Vaping Use: Never used  Substance and Sexual Activity   Alcohol use: Yes  Alcohol/week: 1.0 - 2.0 standard drink of alcohol    Types: 1 - 2 Cans of beer per week   Drug use: No   Sexual activity: Yes    Birth control/protection: Post-menopausal    Comment: Hysterectomy  Other Topics Concern   Not on file  Social History Narrative   Not on file   Social Determinants of Health   Financial Resource Strain: Low Risk  (02/07/2022)   Overall Financial Resource Strain (CARDIA)    Difficulty of Paying Living Expenses: Not hard at all  Food Insecurity: No Food Insecurity (02/07/2022)   Hunger Vital Sign    Worried About Running Out of Food in the Last Year: Never true    Ran Out of Food in the Last Year: Never true  Transportation Needs: No Transportation  Needs (02/07/2022)   PRAPARE - Transportation    Lack of Transportation (Medical): No    Lack of Transportation (Non-Medical): No  Physical Activity: Insufficiently Active (02/07/2022)   Exercise Vital Sign    Days of Exercise per Week: 3 days    Minutes of Exercise per Session: 30 min  Stress: No Stress Concern Present (02/07/2022)   [place]    Feeling of Stress : Not at all  Social Connections: Moderately Integrated (02/07/2022)   Social Connection and Isolation Panel [NHANES]    Frequency of Communication with Friends and Family: More than three times a week    Frequency of Social Gatherings with Friends and Family: More than three times a week    Attends Religious Services: 1 to 4 times per year    Active Member of [employer] or Organizations: No    Attends Archivist Meetings: Never    Marital Status: Married    Tobacco Counseling Counseling given: Not Answered Tobacco comments: Quit 2011   Clinical Intake:  Pre-visit preparation completed: Yes  Pain : No/denies pain     BMI - recorded: 29.63 Nutritional Status: BMI 25 -29 Overweight Nutritional Risks: None Diabetes: No  How often do you need to have someone help you when you read instructions, pamphlets, or other written materials from your doctor or pharmacy?: 1 - Never  Diabetic?NO  Interpreter Needed?: No  Information entered by :: mj Verlisa Vara, lpn   Activities of Daily Living    02/07/2022    9:31 AM  In your present state of health, do you have any difficulty performing the following activities:  Hearing? 0  Vision? 0  Difficulty concentrating or making decisions? 0  Walking or climbing stairs? 0  Dressing or bathing? 0  Doing errands, shopping? 0  Preparing Food and eating ? N  Using the Toilet? N  In the past six months, have you accidently leaked urine? Y  Do you have problems with loss of bowel control? N  Managing your  Medications? N  Managing your Finances? N  Housekeeping or managing your Housekeeping? N    Patient Care Team: Susy Frizzle, MD as PCP - General (Family Medicine)  Indicate any recent Medical Services you may have received from other than [facility] providers in the past year (date may be approximate).     Assessment:   This is a routine wellness examination for Alexa Allen.  Hearing/Vision screen Hearing Screening - Comments:: No hearing issues.  Vision Screening - Comments:: Glasses. Dr. Marica Otter. 04/2022-scheduled.  Dietary issues and exercise activities discussed: Current Exercise Habits: Home exercise routine, Type of exercise: walking, Time (Minutes): 30,  Frequency (Times/Week): 3, Weekly Exercise (Minutes/Week): 90, Intensity: Mild, Exercise limited by: cardiac condition(s)   Goals Addressed             This Visit's Progress    Exercise 3x per week (30 min per time)       Continue to exercise and stay healthy. Travel more.       Depression Screen    02/07/2022    9:22 AM 07/06/2020    9:24 AM 07/06/2019    3:22 PM 05/29/2018    8:24 AM 05/23/2017    8:16 AM 09/27/2016    3:50 PM 05/16/2016    3:14 PM  PHQ 2/9 Scores  PHQ - 2 Score 0 0 0 0 0 0 0  PHQ- 9 Score  0   0      Fall Risk    02/07/2022    9:28 AM 07/06/2020    9:23 AM 05/29/2018    8:24 AM  [place] in the past year? 0 0 No  Number falls in past yr: 0 0   Injury with Fall? 0 0   Risk for fall due to : No Fall Risks    Follow up Falls prevention discussed      [place]:  Any stairs in or around the home? Yes  If so, are there any without handrails? No  Home free of loose throw rugs in walkways, pet beds, electrical cords, etc? Yes  Adequate lighting in your home to reduce risk of falls? Yes   ASSISTIVE DEVICES UTILIZED TO PREVENT FALLS:  Life alert? No  Use of a cane, walker or w/c? No  Grab bars in the bathroom? Yes  Shower chair or bench in  shower? Yes  Elevated toilet seat or a handicapped toilet? No   TIMED UP AND GO:  Was the test performed? No . Phone visit.  Cognitive Function:        02/07/2022    9:32 AM  6CIT Screen  What Year? 0 points  What month? 0 points  What time? 0 points  Count back from 20 0 points  Months in reverse 0 points  Repeat phrase 0 points  Total Score 0 points    Immunizations Immunization History  Administered Date(s) Administered   Fluad Quad(high Dose 65+) 05/30/2021   Influenza,inj,Quad PF,6+ Mos 05/18/2014, 05/15/2016, 07/06/2020   Influenza-Unspecified 05/22/2018, 06/22/2019   PFIZER(Purple Top)SARS-COV-2 Vaccination 04/19/2020, 05/10/2020   Zoster, Live 05/16/2016    TDAP status: Due, Education has been provided regarding the importance of this vaccine. Advised may receive this vaccine at local pharmacy or Health Dept. Aware to provide a copy of the vaccination record if obtained from local pharmacy or Health Dept. Verbalized acceptance and understanding.  Flu Vaccine status: Up to date  Pneumococcal vaccine status: Due, Education has been provided regarding the importance of this vaccine. Advised may receive this vaccine at local pharmacy or Health Dept. Aware to provide a copy of the vaccination record if obtained from local pharmacy or Health Dept. Verbalized acceptance and understanding.  Covid-19 vaccine status: Completed vaccines  Qualifies for Shingles Vaccine? Yes   Zostavax completed Yes   Shingrix Completed?: No.    Education has been provided regarding the importance of this vaccine. Patient has been advised to call insurance company to determine out of pocket expense if they have not yet received this vaccine. Advised may also receive vaccine at local pharmacy or Health Dept. Verbalized acceptance  and understanding.  Screening Tests Health Maintenance  Topic Date Due   URINE MICROALBUMIN  Never done   Pneumonia Vaccine 6+ Years old (1 - PCV) Never done    DEXA SCAN  Never done   TETANUS/TDAP  01/16/2022   COVID-19 Vaccine (3 - Pfizer risk series) 02/23/2022 (Originally 06/07/2020)   Zoster Vaccines- Shingrix (1 of 2) 05/10/2022 (Originally 12/24/1974)   INFLUENZA VACCINE  03/19/2022   COLONOSCOPY (Pts 45-81yr Insurance coverage will need to be confirmed)  06/20/2023   MAMMOGRAM  01/05/2024   Hepatitis C Screening  Completed   HPV VACCINES  Aged Out    Health Maintenance  Health Maintenance Due  Topic Date Due   URINE MICROALBUMIN  Never done   Pneumonia Vaccine 66 Years old (1 - PCV) Never done   DEXA SCAN  Never done   TETANUS/TDAP  01/16/2022    Colorectal cancer screening: Type of screening: Colonoscopy. Completed 06/19/2018. Repeat every 5 years  Mammogram status: Completed 01/04/2022. Repeat every year  Bone Density status: Ordered 02/07/2022..Marland KitchenPt provided with contact info and advised to call to schedule appt.  Lung Cancer Screening: (Low Dose CT Chest recommended if Age 66-80years, 30 pack-year currently smoking OR have quit w/in 15years.) does not qualify.   Additional Screening:  Hepatitis C Screening: does qualify; Completed 05/22/2015  Vision Screening: Recommended annual ophthalmology exams for early detection of glaucoma and other disorders of the eye. Is the patient up to date with their annual eye exam?  Yes  Who is the provider or what is the name of the office in which the patient attends annual eye exams? Dr. SMarica OtterIf pt is not established with a provider, would they like to be referred to a provider to establish care? No .   Dental Screening: Recommended annual dental exams for proper oral hygiene  Community Resource Referral / Chronic Care Management: CRR required this visit?  No   CCM required this visit?  No      Plan:     I have personally reviewed and noted the following in the patient's chart:   Medical and social history Use of alcohol, tobacco or illicit drugs  Current medications  and supplements including opioid prescriptions. Patient is not currently taking opioid prescriptions. Functional ability and status Nutritional status Physical activity Advanced directives List of other physicians Hospitalizations, surgeries, and ER visits in previous 12 months Vitals Screenings to include cognitive, depression, and falls Referrals and appointments  In addition, I have reviewed and discussed with patient certain preventive protocols, quality metrics, and best practice recommendations. A written personalized care plan for preventive services as well as general preventive health recommendations were provided to patient.     MChriss Driver LPN   [id number]  Nurse Notes: Discussed DEXA. Pt would like to have done. Order placed. Discussed Shingrix and Prevnar 20 and how to obtain.

## 2022-03-16 ENCOUNTER — Other Ambulatory Visit: Payer: Self-pay | Admitting: Family Medicine

## 2022-03-18 ENCOUNTER — Other Ambulatory Visit: Payer: Self-pay

## 2022-03-18 NOTE — Telephone Encounter (Signed)
Pharmacy faxed a refill request for  atorvastatin (LIPITOR) 20 MG tablet [[id number]]    Order Details Dose: 20 mg Route: Oral Frequency: Daily  Dispense Quantity: 90 tablet Refills: 3        Sig: Take 1 tablet (20 mg total) by mouth daily.       Start Date: 03/23/21 End Date: --  Written Date: 03/23/21 Expiration Date: 03/23/22

## 2022-03-18 NOTE — Telephone Encounter (Signed)
Requested Prescriptions  Pending Prescriptions Disp Refills  . omeprazole (PRILOSEC) 20 MG capsule [Pharmacy Med Name: OMEPRAZOLE DR 20 MG CAPSULE] 90 capsule 1    Sig: TAKE 1 CAPSULE BY MOUTH EVERY DAY     Gastroenterology: Proton Pump Inhibitors Passed - 03/16/2022  9:51 AM      Passed - Valid encounter within last 12 months    Recent Outpatient Visits          4 months ago Visit for suture removal   [place] Susy Frizzle, MD   5 months ago Leg skin lesion, right   [place], Cammie Mcgee, MD   7 months ago Vertigo   [place] Susy Frizzle, MD   12 months ago Skin lesion of right leg   [place] Susy Frizzle, MD   1 year ago Concussion with loss of consciousness of 30 minutes or less, initial encounter   [place] Pick[place], Cammie Mcgee, MD

## 2022-03-19 MED ORDER — ATORVASTATIN CALCIUM 20 MG PO TABS: 20.0000 mg | ORAL_TABLET | Freq: Every day | ORAL | 3 refills | Status: DC

## 2022-03-19 NOTE — Telephone Encounter (Signed)
Requested medications are due for refill today.  yes  Requested medications are on the active medications list.  yes  Last refill. 03/23/2021 #90 3 refills  Future visit scheduled.   no  Notes to clinic.  Labs are expired.    Requested Prescriptions  Pending Prescriptions Disp Refills   atorvastatin (LIPITOR) 20 MG tablet 90 tablet 3    Sig: Take 1 tablet (20 mg total) by mouth daily.     Cardiovascular:  Antilipid - Statins Failed - 03/18/2022  1:48 PM      Failed - Lipid Panel in normal range within the last 12 months    Cholesterol  Date Value Ref Range Status  07/06/2019 234 (H) <200 mg/dL Final   LDL Cholesterol (Calc)  Date Value Ref Range Status  07/06/2019 156 (H) mg/dL (calc) Final    Comment:    Reference range: <100 . Desirable range <100 mg/dL for primary prevention;   <70 mg/dL for patients with CHD or diabetic patients  with > or = 2 CHD risk factors. Marland Kitchen LDL-C is now calculated using the Martin-Hopkins  calculation, which is a validated novel method providing  better accuracy than the Friedewald equation in the  estimation of LDL-C.  Cresenciano Genre et al. Annamaria Helling. 7262;035(59): 2061-2068  (http://education.[facility].com/faq/FAQ164)    HDL  Date Value Ref Range Status  07/06/2019 63 > OR = 50 mg/dL Final   Triglycerides  Date Value Ref Range Status  07/06/2019 54 <150 mg/dL Final         Passed - Patient is not pregnant      Passed - Valid encounter within last 12 months    Recent Outpatient Visits           4 months ago Visit for suture removal   [place] [facility], Cammie Mcgee, MD   5 months ago Leg skin lesion, right   [place] Pickard, Cammie Mcgee, MD   7 months ago Vertigo   [place] Susy Frizzle, MD   12 months ago Skin lesion of right leg   [place] Susy Frizzle, MD   1 year ago Concussion with loss of consciousness of 30 minutes or less, initial encounter    [place] Pickard, Cammie Mcgee, MD

## 2022-04-02 ENCOUNTER — Ambulatory Visit
Admission: RE | Admit: 2022-04-02 | Discharge: 2022-04-02 | Disposition: A | Discharge status: Home / Self Care | Payer: Medicare HMO | Source: Ambulatory Visit | Attending: Family Medicine | Admitting: Family Medicine

## 2022-04-02 DIAGNOSIS — Z780 Asymptomatic menopausal state: Secondary | ICD-10-CM

## 2022-04-02 DIAGNOSIS — M8589 Other specified disorders of bone density and structure, multiple sites: Secondary | ICD-10-CM | POA: Diagnosis not present

## 2022-04-02 DIAGNOSIS — M810 Age-related osteoporosis without current pathological fracture: Secondary | ICD-10-CM | POA: Diagnosis not present

## 2022-04-04 ENCOUNTER — Encounter: Payer: Self-pay | Admitting: Family Medicine

## 2022-04-05 ENCOUNTER — Ambulatory Visit (INDEPENDENT_AMBULATORY_CARE_PROVIDER_SITE_OTHER): Payer: BC Managed Care – PPO | Admitting: Family Medicine

## 2022-04-05 ENCOUNTER — Other Ambulatory Visit: Payer: Self-pay

## 2022-04-05 VITALS — BP 118/62 | HR 98 | Temp 98.4°F | Ht 65.0 in | Wt 164.4 lb

## 2022-04-05 DIAGNOSIS — Z780 Asymptomatic menopausal state: Secondary | ICD-10-CM

## 2022-04-05 DIAGNOSIS — R051 Acute cough: Secondary | ICD-10-CM

## 2022-04-05 DIAGNOSIS — M810 Age-related osteoporosis without current pathological fracture: Secondary | ICD-10-CM

## 2022-04-05 MED ORDER — AMOXICILLIN-POT CLAVULANATE 875-125 MG PO TABS: 1.0000 | ORAL_TABLET | Freq: Two times a day (BID) | ORAL | 0 refills | Status: DC

## 2022-04-05 MED ORDER — ALENDRONATE SODIUM 70 MG PO TABS: 70.0000 mg | ORAL_TABLET | ORAL | 11 refills | Status: DC

## 2022-04-05 MED ORDER — FLUCONAZOLE 150 MG PO TABS: 150.0000 mg | ORAL_TABLET | Freq: Once | ORAL | 0 refills | Status: AC

## 2022-04-05 NOTE — Progress Notes (Signed)
Subjective:    Patient ID: Alexa Allen, female    DOB: 1955/11/07, 66 y.o.   MRN: [id number]  Cough  Patient reports a cough for almost 2 months.  She states that she is coughing up green sputum.  She denies any chest pain or pleurisy or hemoptysis or fever or chills.  She does report pain in her frontal and maxillary sinuses.  She does report congestion in her head.  She believes she may have a sinus infection.  She denies any night sweats.  She denies any weight loss.  She denies any wheezing. Past Medical History:  Diagnosis Date   Arthritis    hands   Elevated liver enzymes    GERD (gastroesophageal reflux disease)    HSV (herpes simplex virus) infection    Liver cyst    Osteoporosis    SVD (spontaneous vaginal delivery)    x 2   Wears partial dentures    upper   Past Surgical History:  Procedure Laterality Date   bladder tack     COLONOSCOPY     02/2008    MULTIPLE TOOTH EXTRACTIONS     TUBAL LIGATION     VAGINAL HYSTERECTOMY     WISDOM TOOTH EXTRACTION     Current Outpatient Medications on File Prior to Visit  Medication Sig Dispense Refill   acyclovir (ZOVIRAX) 400 MG tablet Take 400 mg by mouth daily.     atorvastatin (LIPITOR) 20 MG tablet Take 1 tablet (20 mg total) by mouth daily. 90 tablet 3   Multiple Vitamin (MULTIVITAMIN) tablet Take 1 tablet by mouth daily.     omeprazole (PRILOSEC) 20 MG capsule TAKE 1 CAPSULE BY MOUTH EVERY DAY 90 capsule 1   No current facility-administered medications on file prior to visit.   Allergies  Allergen Reactions   Iodinated Contrast Media Hives    (Iohexol) hives with IV cm- tolerated 1 hour quick prep well, Onset Date: [id number]   Oxycodone-Acetaminophen Other (See Comments)    Hallucinations and itching   Social History   Socioeconomic History   Marital status: Married    Spouse name: Not on file   Number of children: 2   Years of education: Not on file   Highest education level: Not on file  Occupational  History   Occupation: Public relations account executive  Tobacco Use   Smoking status: Former    Packs/day: 0.25    Types: Cigarettes   Smokeless tobacco: Never   Tobacco comments:    Quit 2011  Vaping Use   Vaping Use: Never used  Substance and Sexual Activity   Alcohol use: Yes    Alcohol/week: 1.0 - 2.0 standard drink of alcohol    Types: 1 - 2 Cans of beer per week   Drug use: No   Sexual activity: Yes    Birth control/protection: Post-menopausal    Comment: Hysterectomy  Other Topics Concern   Not on file  Social History Narrative   Not on file   Social Determinants of Health   Financial Resource Strain: Low Risk  (02/07/2022)   Overall Financial Resource Strain (CARDIA)    Difficulty of Paying Living Expenses: Not hard at all  Food Insecurity: No Food Insecurity (02/07/2022)   Hunger Vital Sign    Worried About Running Out of Food in the Last Year: Never true    Ran Out of Food in the Last Year: Never true  Transportation Needs: No Transportation Needs (02/07/2022)   PRAPARE - Transportation  Lack of Transportation (Medical): No    Lack of Transportation (Non-Medical): No  Physical Activity: Insufficiently Active (02/07/2022)   Exercise Vital Sign    Days of Exercise per Week: 3 days    Minutes of Exercise per Session: 30 min  Stress: No Stress Concern Present (02/07/2022)   [place]    Feeling of Stress : Not at all  Social Connections: Moderately Integrated (02/07/2022)   Social Connection and Isolation Panel [NHANES]    Frequency of Communication with Friends and Family: More than three times a week    Frequency of Social Gatherings with Friends and Family: More than three times a week    Attends Religious Services: 1 to 4 times per year    Active Member of [employer] or Organizations: No    Attends Archivist Meetings: Never    Marital Status: Married  Human resources officer Violence: Not At Risk (02/07/2022)    Humiliation, Afraid, Rape, and Kick questionnaire    Fear of Current or Ex-Partner: No    Emotionally Abused: No    Physically Abused: No    Sexually Abused: No     Review of Systems  Respiratory:  Positive for cough.   All other systems reviewed and are negative.      Objective:   Physical Exam Vitals reviewed.  Constitutional:      General: She is not in acute distress.    Appearance: Normal appearance. She is normal weight. She is not ill-appearing or toxic-appearing.  HENT:     Head: Normocephalic and atraumatic. No left periorbital erythema.     Jaw: There is normal jaw occlusion.     Right Ear: Tympanic membrane and ear canal normal.     Left Ear: Tympanic membrane and ear canal normal.     Nose:     Right Sinus: Frontal sinus tenderness present.     Left Sinus: Frontal sinus tenderness present.  Eyes:     General: Lids are everted, no foreign bodies appreciated. Vision grossly intact. Gaze aligned appropriately.        Right eye: No foreign body.        Left eye: No foreign body.     Extraocular Movements: Extraocular movements intact.     Right eye: Normal extraocular motion.     Left eye: Normal extraocular motion.     Conjunctiva/sclera:     Right eye: Right conjunctiva is not injected. No chemosis, exudate or hemorrhage.    Left eye: Left conjunctiva is not injected. No chemosis, exudate or hemorrhage.    Pupils: Pupils are equal, round, and reactive to light.  Cardiovascular:     Rate and Rhythm: Normal rate and regular rhythm.     Heart sounds: Normal heart sounds. No murmur heard.    No friction rub. No gallop.  Pulmonary:     Effort: Pulmonary effort is normal. No respiratory distress.     Breath sounds: Normal breath sounds. No wheezing, rhonchi or rales.  Musculoskeletal:     Right lower leg: No edema.     Left lower leg: No edema.  Neurological:     General: No focal deficit present.     Mental Status: She is alert and oriented to person, place,  and time. Mental status is at baseline.     Cranial Nerves: No cranial nerve deficit.     Motor: No weakness.     Gait: Gait normal.  Assessment & Plan:  Acute cough - Plan: DG Chest 2 View I believe the patient may have subacute sinus infection causing postnasal drip causing her cough.  I will start her on Augmentin 875 mg twice daily for 10 days and obtain chest x-ray.

## 2022-04-11 ENCOUNTER — Other Ambulatory Visit: Payer: Self-pay

## 2022-04-11 NOTE — Telephone Encounter (Signed)
Requested medication (s) are due for refill today: Amount not specified  Requested medication (s) are on the active medication list: yes    Last refill: 10/15/21  Future visit scheduled :   with  nurse 02/13/23   Notes to clinic: Historical provider, please review. Thank you  Requested Prescriptions  Pending Prescriptions Disp Refills   acyclovir (ZOVIRAX) 400 MG tablet      Sig: Take 1 tablet (400 mg total) by mouth daily.     Antimicrobials:  Antiviral Agents - Anti-Herpetic Passed - 04/11/2022  2:03 PM      Passed - Valid encounter within last 12 months    Recent Outpatient Visits           5 months ago Visit for suture removal   [place] D[facility] Schaumann Cammie Mcgee, MD   6 months ago Leg skin lesion, right   [place] Denn[place] Schaumann, Cammie Mcgee, MD   7 months ago Vertigo   [place] Schaumann, Cammie Mcgee, MD   1 year ago Skin lesion of right leg   [place] Susy Frizzle, MD   1 year ago Concussion with loss of consciousness of 30 minutes or less, initial encounter   [place] Pickard, Cammie Mcgee, MD

## 2022-04-11 NOTE — Telephone Encounter (Signed)
Pharmacy faxed a refill request for acyclovir (ZOVIRAX) 400 MG tablet [[id number]]    Order Details Dose: 400 mg Route: Oral Frequency: Daily  Dispense Quantity: -- Refills: --        Sig: Take 400 mg by mouth daily.       Start Date: 10/10/21 End Date: --  Written Date: -- Expiration Date: --  Ordering Date: 10/15/21

## 2022-04-12 MED ORDER — ACYCLOVIR 400 MG PO TABS: 400.0000 mg | ORAL_TABLET | Freq: Every day | ORAL | 1 refills | Status: DC

## 2022-04-26 ENCOUNTER — Telehealth: Payer: Self-pay | Admitting: Family Medicine

## 2022-04-26 NOTE — Telephone Encounter (Signed)
Patient called to request a different prescription; stated phlegm and head congestion lingering; no fever.  Pharmacy confirmed as   [id number] [place], [place] [id number] Phone:  [phone] Fax:  [phone] DEA #:  [id number] Please advise at [phone].

## 2022-05-03 ENCOUNTER — Ambulatory Visit (INDEPENDENT_AMBULATORY_CARE_PROVIDER_SITE_OTHER): Payer: Medicare HMO | Admitting: Family Medicine

## 2022-05-03 VITALS — BP 120/70 | HR 75 | Ht 62.0 in | Wt 164.4 lb

## 2022-05-03 DIAGNOSIS — J329 Chronic sinusitis, unspecified: Secondary | ICD-10-CM | POA: Diagnosis not present

## 2022-05-03 MED ORDER — PREDNISONE 20 MG PO TABS: 40.0000 mg | ORAL_TABLET | Freq: Every day | ORAL | 0 refills | Status: DC

## 2022-05-03 MED ORDER — LEVOFLOXACIN 500 MG PO TABS: 500.0000 mg | ORAL_TABLET | Freq: Every day | ORAL | 0 refills | Status: AC

## 2022-05-03 NOTE — Progress Notes (Signed)
Subjective:    Patient ID: Alexa Allen, female    DOB: 10/28/1955, 66 y.o.   MRN: [id number]  Patient has been battling cough and sinus congestion for 3 months now.  She reports sinus pressure.  She is very congested.  She has very nasal voice.  She reports postnasal drip.  Congestion.  She has occasional headaches.  In August I tried her on Augmentin which helped temporarily.  About a week ago I started her on Nasonex as well as Xyzal which did not help.  She presents today with sinus congestion, postnasal drip, and head congestion. Past Medical History:  Diagnosis Date   Arthritis    hands   Elevated liver enzymes    GERD (gastroesophageal reflux disease)    HSV (herpes simplex virus) infection    Liver cyst    Osteoporosis    SVD (spontaneous vaginal delivery)    x 2   Wears partial dentures    upper   Past Surgical History:  Procedure Laterality Date   bladder tack     COLONOSCOPY     02/2008    MULTIPLE TOOTH EXTRACTIONS     TUBAL LIGATION     VAGINAL HYSTERECTOMY     WISDOM TOOTH EXTRACTION     Current Outpatient Medications on File Prior to Visit  Medication Sig Dispense Refill   acyclovir (ZOVIRAX) 400 MG tablet Take 1 tablet (400 mg total) by mouth daily. 90 tablet 1   alendronate (FOSAMAX) 70 MG tablet Take 1 tablet (70 mg total) by mouth every 7 (seven) days. Take with a full glass of water on an empty stomach. 4 tablet 11   atorvastatin (LIPITOR) 20 MG tablet Take 1 tablet (20 mg total) by mouth daily. 90 tablet 3   Multiple Vitamin (MULTIVITAMIN) tablet Take 1 tablet by mouth daily.     omeprazole (PRILOSEC) 20 MG capsule TAKE 1 CAPSULE BY MOUTH EVERY DAY 90 capsule 1   No current facility-administered medications on file prior to visit.   Allergies  Allergen Reactions   Iodinated Contrast Media Hives    (Iohexol) hives with IV cm- tolerated 1 hour quick prep well, Onset Date: [id number]   Oxycodone-Acetaminophen Other (See Comments)    Hallucinations and  itching   Social History   Socioeconomic History   Marital status: Married    Spouse name: Not on file   Number of children: 2   Years of education: Not on file   Highest education level: Not on file  Occupational History   Occupation: Public relations account executive  Tobacco Use   Smoking status: Former    Packs/day: 0.25    Types: Cigarettes   Smokeless tobacco: Never   Tobacco comments:    Quit 2011  Vaping Use   Vaping Use: Never used  Substance and Sexual Activity   Alcohol use: Yes    Alcohol/week: 1.0 - 2.0 standard drink of alcohol    Types: 1 - 2 Cans of beer per week   Drug use: No   Sexual activity: Yes    Birth control/protection: Post-menopausal    Comment: Hysterectomy  Other Topics Concern   Not on file  Social History Narrative   Not on file   Social Determinants of Health   Financial Resource Strain: Low Risk  (02/07/2022)   Overall Financial Resource Strain (CARDIA)    Difficulty of Paying Living Expenses: Not hard at all  Food Insecurity: No Food Insecurity (02/07/2022)   Hunger Vital Sign    Worried  About Running Out of Food in the Last Year: Never true    Ran Out of Food in the Last Year: Never true  Transportation Needs: No Transportation Needs (02/07/2022)   PRAPARE - Hydrologist (Medical): No    Lack of Transportation (Non-Medical): No  Physical Activity: Insufficiently Active (02/07/2022)   Exercise Vital Sign    Days of Exercise per Week: 3 days    Minutes of Exercise per Session: 30 min  Stress: No Stress Concern Present (02/07/2022)   [place]    Feeling of Stress : Not at all  Social Connections: Moderately Integrated (02/07/2022)   Social Connection and Isolation Panel [NHANES]    Frequency of Communication with Friends and Family: More than three times a week    Frequency of Social Gatherings with Friends and Family: More than three times a week    Attends  Religious Services: 1 to 4 times per year    Active Member of [employer] or Organizations: No    Attends Archivist Meetings: Never    Marital Status: Married  Human resources officer Violence: Not At Risk (02/07/2022)   Humiliation, Afraid, Rape, and Kick questionnaire    Fear of Current or Ex-Partner: No    Emotionally Abused: No    Physically Abused: No    Sexually Abused: No     Review of Systems  Respiratory:  Positive for cough.   All other systems reviewed and are negative.      Objective:   Physical Exam Vitals reviewed.  Constitutional:      General: She is not in acute distress.    Appearance: Normal appearance. She is normal weight. She is not ill-appearing or toxic-appearing.  HENT:     Head: Normocephalic and atraumatic. No left periorbital erythema.     Jaw: There is normal jaw occlusion.     Right Ear: Tympanic membrane and ear canal normal.     Left Ear: Tympanic membrane and ear canal normal.     Nose: Congestion and rhinorrhea present.     Right Turbinates: Enlarged and swollen.     Left Turbinates: Enlarged and swollen.     Right Sinus: Frontal sinus tenderness present.     Left Sinus: Frontal sinus tenderness present.  Eyes:     General: Lids are everted, no foreign bodies appreciated. Vision grossly intact. Gaze aligned appropriately.        Right eye: No foreign body.        Left eye: No foreign body.     Extraocular Movements: Extraocular movements intact.     Right eye: Normal extraocular motion.     Left eye: Normal extraocular motion.     Conjunctiva/sclera:     Right eye: Right conjunctiva is not injected. No chemosis, exudate or hemorrhage.    Left eye: Left conjunctiva is not injected. No chemosis, exudate or hemorrhage.    Pupils: Pupils are equal, round, and reactive to light.  Cardiovascular:     Rate and Rhythm: Normal rate and regular rhythm.     Heart sounds: Normal heart sounds. No murmur heard.    No friction rub. No gallop.   Pulmonary:     Effort: Pulmonary effort is normal. No respiratory distress.     Breath sounds: Normal breath sounds. No wheezing, rhonchi or rales.  Musculoskeletal:     Right lower leg: No edema.     Left lower leg: No edema.  Neurological:     General: No focal deficit present.     Mental Status: She is alert and oriented to person, place, and time. Mental status is at baseline.     Cranial Nerves: No cranial nerve deficit.     Motor: No weakness.     Gait: Gait normal.           Assessment & Plan:  Chronic sinusitis, unspecified location I believe the patient has been dealing with a chronic sinus infection now for almost 3 months. Prednisone 40 mg a day for 10 days.  Use nasal saline irrigation 2-3 times a day.  Continue Xyzal and Nasonex.  Add Levaquin 500 mg daily for 7 days.  Reassess in 2 weeks.  If no better proceed with a CT scan of the sinuses.

## 2022-05-06 DIAGNOSIS — Z825 Family history of asthma and other chronic lower respiratory diseases: Secondary | ICD-10-CM | POA: Diagnosis not present

## 2022-05-06 DIAGNOSIS — Z85828 Personal history of other malignant neoplasm of skin: Secondary | ICD-10-CM | POA: Diagnosis not present

## 2022-05-06 DIAGNOSIS — A6000 Herpesviral infection of urogenital system, unspecified: Secondary | ICD-10-CM | POA: Diagnosis not present

## 2022-05-06 DIAGNOSIS — K219 Gastro-esophageal reflux disease without esophagitis: Secondary | ICD-10-CM | POA: Diagnosis not present

## 2022-05-06 DIAGNOSIS — M8580 Other specified disorders of bone density and structure, unspecified site: Secondary | ICD-10-CM | POA: Diagnosis not present

## 2022-05-06 DIAGNOSIS — Z809 Family history of malignant neoplasm, unspecified: Secondary | ICD-10-CM | POA: Diagnosis not present

## 2022-05-06 DIAGNOSIS — Z91041 Radiographic dye allergy status: Secondary | ICD-10-CM | POA: Diagnosis not present

## 2022-05-06 DIAGNOSIS — Z7983 Long term (current) use of bisphosphonates: Secondary | ICD-10-CM | POA: Diagnosis not present

## 2022-05-06 DIAGNOSIS — M1990 Unspecified osteoarthritis, unspecified site: Secondary | ICD-10-CM | POA: Diagnosis not present

## 2022-05-06 DIAGNOSIS — Z87891 Personal history of nicotine dependence: Secondary | ICD-10-CM | POA: Diagnosis not present

## 2022-05-06 DIAGNOSIS — R69 Illness, unspecified: Secondary | ICD-10-CM | POA: Diagnosis not present

## 2022-05-06 DIAGNOSIS — I10 Essential (primary) hypertension: Secondary | ICD-10-CM | POA: Diagnosis not present

## 2022-05-14 DIAGNOSIS — H5213 Myopia, bilateral: Secondary | ICD-10-CM | POA: Diagnosis not present

## 2022-08-05 ENCOUNTER — Other Ambulatory Visit: Payer: Medicare HMO

## 2022-08-05 DIAGNOSIS — R6889 Other general symptoms and signs: Secondary | ICD-10-CM | POA: Diagnosis not present

## 2022-08-05 DIAGNOSIS — I700 Atherosclerosis of aorta: Secondary | ICD-10-CM | POA: Diagnosis not present

## 2022-08-06 LAB — COMPLETE METABOLIC PANEL WITH GFR
AG Ratio: 1.8 (calc) (ref 1.0–2.5)
ALT: 47 U/L — ABNORMAL HIGH (ref 6–29)
AST: 35 U/L (ref 10–35)
Albumin: 4.3 g/dL (ref 3.6–5.1)
Alkaline phosphatase (APISO): 90 U/L (ref 37–153)
BUN: 16 mg/dL (ref 7–25)
CO2: 27 mmol/L (ref 20–32)
Calcium: 9.2 mg/dL (ref 8.6–10.4)
Chloride: 105 mmol/L (ref 98–110)
Creat: 0.7 mg/dL (ref 0.50–1.05)
Globulin: 2.4 g/dL (calc) (ref 1.9–3.7)
Glucose, Bld: 98 mg/dL (ref 65–99)
Potassium: 4 mmol/L (ref 3.5–5.3)
Sodium: 140 mmol/L (ref 135–146)
Total Bilirubin: 0.5 mg/dL (ref 0.2–1.2)
Total Protein: 6.7 g/dL (ref 6.1–8.1)
eGFR: 95 mL/min/{1.73_m2} (ref >=60)

## 2022-08-06 LAB — CBC WITH DIFFERENTIAL/PLATELET
Absolute Monocytes: 302 cells/uL (ref 200–950)
Basophils Absolute: 62 cells/uL (ref 0–200)
Basophils Relative: 1.3 %
Eosinophils Absolute: 211 cells/uL (ref 15–500)
Eosinophils Relative: 4.4 %
HCT: 41.6 % (ref 35.0–45.0)
Hemoglobin: 14.2 g/dL (ref 11.7–15.5)
Lymphs Abs: 1834 cells/uL (ref 850–3900)
MCH: 30.3 pg (ref 27.0–33.0)
MCHC: 34.1 g/dL (ref 32.0–36.0)
MCV: 88.9 fL (ref 80.0–100.0)
MPV: 12.3 fL (ref 7.5–12.5)
Monocytes Relative: 6.3 %
Neutro Abs: 2390 cells/uL (ref 1500–7800)
Neutrophils Relative %: 49.8 %
Platelets: 180 10*3/uL (ref 140–400)
RBC: 4.68 10*6/uL (ref 3.80–5.10)
RDW: 12.2 % (ref 11.0–15.0)
Total Lymphocyte: 38.2 %
WBC: 4.8 10*3/uL (ref 3.8–10.8)

## 2022-08-06 LAB — LIPID PANEL
Cholesterol: 154 mg/dL (ref <200)
HDL: 67 mg/dL (ref >=50)
LDL Cholesterol (Calc): 68 mg/dL (calc)
Non-HDL Cholesterol (Calc): 87 mg/dL (calc) (ref <130)
Total CHOL/HDL Ratio: 2.3 (calc) (ref <5.0)
Triglycerides: 100 mg/dL (ref <150)

## 2022-08-09 ENCOUNTER — Encounter: Payer: BC Managed Care – PPO | Admitting: Family Medicine

## 2022-09-03 ENCOUNTER — Ambulatory Visit (INDEPENDENT_AMBULATORY_CARE_PROVIDER_SITE_OTHER): Payer: Medicare HMO | Admitting: Family Medicine

## 2022-09-03 ENCOUNTER — Encounter: Payer: Self-pay | Admitting: Family Medicine

## 2022-09-03 VITALS — BP 124/76 | HR 77 | Temp 98.4°F | Ht 62.0 in | Wt 164.0 lb

## 2022-09-03 DIAGNOSIS — N281 Cyst of kidney, acquired: Secondary | ICD-10-CM

## 2022-09-03 DIAGNOSIS — Z0001 Encounter for general adult medical examination with abnormal findings: Secondary | ICD-10-CM | POA: Diagnosis not present

## 2022-09-03 DIAGNOSIS — Z0000 Encounter for general adult medical examination without abnormal findings: Secondary | ICD-10-CM

## 2022-09-03 NOTE — Progress Notes (Signed)
Subjective:    Patient ID: Alexa Allen, female    DOB: 1955-10-24, 67 y.o.   MRN: [id number]  HPI Patient is a very pleasant 67 year old Caucasian female who is here today for complete physical exam.  Her last colonoscopy was in May 2023.  Her last colonoscopy was November 2019 and due to a tubular adenoma they recommended a repeat colonoscopy in 5 years.  She is due this year.  She no longer needs a Pap smear as she has a history of TAH/BSO.  She had DEXA last year showing osteoporosis and she is on Fosamax calcium and vitamin D.  She believes she had a pneumonia shot earlier this year.  She is due for RSV, COVID booster, and flu shot.  She wants to get this at her local pharmacy.  Shingles vaccine is up-to-date.  Most recent lab work is listed below Lab on 08/05/2022  Component Date Value Ref Range Status   WBC 08/05/2022 4.8  3.8 - 10.8 Thousand/uL Final   RBC 08/05/2022 4.68  3.80 - 5.10 Million/uL Final   Hemoglobin 08/05/2022 14.2  11.7 - 15.5 g/dL Final   HCT 08/05/2022 41.6  35.0 - 45.0 % Final   MCV 08/05/2022 88.9  80.0 - 100.0 fL Final   MCH 08/05/2022 30.3  27.0 - 33.0 pg Final   MCHC 08/05/2022 34.1  32.0 - 36.0 g/dL Final   RDW 08/05/2022 12.2  11.0 - 15.0 % Final   Platelets 08/05/2022 180  140 - 400 Thousand/uL Final   MPV 08/05/2022 12.3  7.5 - 12.5 fL Final   Neutro Abs 08/05/2022 2,390  1,500 - 7,800 cells/uL Final   Lymphs Abs 08/05/2022 1,834  850 - 3,900 cells/uL Final   Absolute Monocytes 08/05/2022 302  200 - 950 cells/uL Final   Eosinophils Absolute 08/05/2022 211  15 - 500 cells/uL Final   Basophils Absolute 08/05/2022 62  0 - 200 cells/uL Final   Neutrophils Relative % 08/05/2022 49.8  % Final   Total Lymphocyte 08/05/2022 38.2  % Final   Monocytes Relative 08/05/2022 6.3  % Final   Eosinophils Relative 08/05/2022 4.4  % Final   Basophils Relative 08/05/2022 1.3  % Final   Glucose, Bld 08/05/2022 98  65 - 99 mg/dL Final   Comment: .            Fasting  reference interval .    BUN 08/05/2022 16  7 - 25 mg/dL Final   Creat 08/05/2022 0.70  0.50 - 1.05 mg/dL Final   eGFR 08/05/2022 95  > OR = 60 mL/min/1.40m Final   BUN/Creatinine Ratio 08/05/2022 SEE NOTE:  6 - 22 (calc) Final   Comment:    Not Reported: BUN and Creatinine are within    reference range. .    Sodium 08/05/2022 140  135 - 146 mmol/L Final   Potassium 08/05/2022 4.0  3.5 - 5.3 mmol/L Final   Chloride 08/05/2022 105  98 - 110 mmol/L Final   CO2 08/05/2022 27  20 - 32 mmol/L Final   Calcium 08/05/2022 9.2  8.6 - 10.4 mg/dL Final   Total Protein 08/05/2022 6.7  6.1 - 8.1 g/dL Final   Albumin 08/05/2022 4.3  3.6 - 5.1 g/dL Final   Globulin 08/05/2022 2.4  1.9 - 3.7 g/dL (calc) Final   AG Ratio 08/05/2022 1.8  1.0 - 2.5 (calc) Final   Total Bilirubin 08/05/2022 0.5  0.2 - 1.2 mg/dL Final   Alkaline phosphatase (APISO) 08/05/2022 90  37 -  153 U/L Final   AST 08/05/2022 35  10 - 35 U/L Final   ALT 08/05/2022 47 (H)  6 - 29 U/L Final   Cholesterol 08/05/2022 154  <200 mg/dL Final   HDL 08/05/2022 67  > OR = 50 mg/dL Final   Triglycerides 08/05/2022 100  <150 mg/dL Final   LDL Cholesterol (Calc) 08/05/2022 68  mg/dL (calc) Final   Comment: Reference range: <100 . Desirable range <100 mg/dL for primary prevention;   <70 mg/dL for patients with CHD or diabetic patients  with > or = 2 CHD risk factors. Marland Kitchen LDL-C is now calculated using the Martin-Hopkins  calculation, which is a validated novel method providing  better accuracy than the Friedewald equation in the  estimation of LDL-C.  Cresenciano Genre et al. Annamaria Helling. 3662;947(65): 2061-2068  (http://education.[facility].com/faq/FAQ164)    Total CHOL/HDL Ratio 08/05/2022 2.3  <5.0 (calc) Final   Non-HDL Cholesterol (Calc) 08/05/2022 87  <130 mg/dL (calc) Final   Comment: For patients with diabetes plus 1 major ASCVD risk  factor, treating to a non-HDL-C goal of <100 mg/dL  (LDL-C of <70 mg/dL) is considered a therapeutic   option.     Immunization History  Administered Date(s) Administered   Fluad Quad(high Dose 65+) 05/30/2021   Influenza,inj,Quad PF,6+ Mos 05/18/2014, 05/15/2016, 07/06/2020   Influenza-Unspecified 05/22/2018, 06/22/2019   PFIZER(Purple Top)SARS-COV-2 Vaccination 04/19/2020, 05/10/2020   Zoster, Live 05/16/2016    Past Medical History:  Diagnosis Date   Arthritis    hands   Elevated liver enzymes    GERD (gastroesophageal reflux disease)    HSV (herpes simplex virus) infection    Liver cyst    Osteoporosis    SVD (spontaneous vaginal delivery)    x 2   Wears partial dentures    upper   Past Surgical History:  Procedure Laterality Date   bladder tack     COLONOSCOPY     02/2008    MULTIPLE TOOTH EXTRACTIONS     TUBAL LIGATION     VAGINAL HYSTERECTOMY     WISDOM TOOTH EXTRACTION     Current Outpatient Medications on File Prior to Visit  Medication Sig Dispense Refill   acyclovir (ZOVIRAX) 400 MG tablet Take 1 tablet (400 mg total) by mouth daily. 90 tablet 1   alendronate (FOSAMAX) 70 MG tablet Take 1 tablet (70 mg total) by mouth every 7 (seven) days. Take with a full glass of water on an empty stomach. 4 tablet 11   atorvastatin (LIPITOR) 20 MG tablet Take 1 tablet (20 mg total) by mouth daily. 90 tablet 3   Multiple Vitamin (MULTIVITAMIN) tablet Take 1 tablet by mouth daily.     omeprazole (PRILOSEC) 20 MG capsule TAKE 1 CAPSULE BY MOUTH EVERY DAY 90 capsule 1   predniSONE (DELTASONE) 20 MG tablet Take 2 tablets (40 mg total) by mouth daily with breakfast. 20 tablet 0   No current facility-administered medications on file prior to visit.   Allergies  Allergen Reactions   Iodinated Contrast Media Hives    (Iohexol) hives with IV cm- tolerated 1 hour quick prep well, Onset Date: [id number]   Oxycodone-Acetaminophen Other (See Comments)    Hallucinations and itching   Social History   Socioeconomic History   Marital status: Married    Spouse name: Not on file    Number of children: 2   Years of education: Not on file   Highest education level: Not on file  Occupational History   Occupation: Public relations account executive  Tobacco Use   Smoking status: Former    Packs/day: 0.25    Types: Cigarettes   Smokeless tobacco: Never   Tobacco comments:    Quit 2011  Vaping Use   Vaping Use: Never used  Substance and Sexual Activity   Alcohol use: Yes    Alcohol/week: 1.0 - 2.0 standard drink of alcohol    Types: 1 - 2 Cans of beer per week   Drug use: No   Sexual activity: Yes    Birth control/protection: Post-menopausal    Comment: Hysterectomy  Other Topics Concern   Not on file  Social History Narrative   Not on file   Social Determinants of Health   Financial Resource Strain: Low Risk  (02/07/2022)   Overall Financial Resource Strain (CARDIA)    Difficulty of Paying Living Expenses: Not hard at all  Food Insecurity: No Food Insecurity (02/07/2022)   Hunger Vital Sign    Worried About Running Out of Food in the Last Year: Never true    Ran Out of Food in the Last Year: Never true  Transportation Needs: No Transportation Needs (02/07/2022)   PRAPARE - Transportation    Lack of Transportation (Medical): No    Lack of Transportation (Non-Medical): No  Physical Activity: Insufficiently Active (02/07/2022)   Exercise Vital Sign    Days of Exercise per Week: 3 days    Minutes of Exercise per Session: 30 min  Stress: No Stress Concern Present (02/07/2022)   [place]    Feeling of Stress : Not at all  Social Connections: Moderately Integrated (02/07/2022)   Social Connection and Isolation Panel [NHANES]    Frequency of Communication with Friends and Family: More than three times a week    Frequency of Social Gatherings with Friends and Family: More than three times a week    Attends Religious Services: 1 to 4 times per year    Active Member of [employer] or Organizations: No    Attends Theatre manager Meetings: Never    Marital Status: Married  Human resources officer Violence: Not At Risk (02/07/2022)   Humiliation, Afraid, Rape, and Kick questionnaire    Fear of Current or Ex-Partner: No    Emotionally Abused: No    Physically Abused: No    Sexually Abused: No     Review of Systems  All other systems reviewed and are negative.      Objective:   Physical Exam Vitals reviewed.  Constitutional:      General: She is not in acute distress.    Appearance: She is well-developed. She is not diaphoretic.  HENT:     Head: Normocephalic and atraumatic.     Right Ear: External ear normal.     Left Ear: External ear normal.     Nose: Nose normal.     Mouth/Throat:     Pharynx: No oropharyngeal exudate.  Eyes:     General: No scleral icterus.       Right eye: No discharge.        Left eye: No discharge.     Conjunctiva/sclera: Conjunctivae normal.     Pupils: Pupils are equal, round, and reactive to light.  Neck:     Thyroid: No thyromegaly.     Vascular: No JVD.     Trachea: No tracheal deviation.  Cardiovascular:     Rate and Rhythm: Normal rate and regular rhythm.     Heart sounds: Normal heart sounds. No  murmur heard.    No friction rub. No gallop.  Pulmonary:     Effort: Pulmonary effort is normal. No respiratory distress.     Breath sounds: Normal breath sounds. No stridor. No wheezing or rales.  Chest:     Chest wall: No tenderness.  Abdominal:     General: Bowel sounds are normal. There is no distension.     Palpations: Abdomen is soft. There is no mass.     Tenderness: There is no abdominal tenderness. There is no guarding or rebound.  Musculoskeletal:        General: No tenderness. Normal range of motion.     Cervical back: Normal range of motion and neck supple.  Lymphadenopathy:     Cervical: No cervical adenopathy.  Skin:    General: Skin is warm.     Coloration: Skin is not pale.     Findings: No erythema or rash.  Neurological:     Mental  Status: She is alert and oriented to person, place, and time.     Cranial Nerves: No cranial nerve deficit.     Motor: No abnormal muscle tone.     Coordination: Coordination normal.     Deep Tendon Reflexes: Reflexes are normal and symmetric.  Psychiatric:        Behavior: Behavior normal.        Thought Content: Thought content normal.        Judgment: Judgment normal.         Assessment & Plan:  Renal cyst - Plan: US Renal  Encounter for Medicare annual wellness exam Physical exam today is normal.  Lab work is excellent except for mild elevation in her ALT.  She has a history of a complex cyst on her right kidney that urology recommended a repeat ultrasound in 1 year.  I will schedule this for her.  Cholesterol is excellent.  Mammogram, colonoscopy are up-to-date.  Pap smear is not necessary.  Bone density is due again in 2026.  Recommended a flu shot, COVID shot, and an RSV vaccine.  Patient believes she has had the pneumonia vaccine at her pharmacy as well as her shingles shot

## 2022-09-05 ENCOUNTER — Ambulatory Visit
Admission: RE | Admit: 2022-09-05 | Discharge: 2022-09-05 | Disposition: A | Discharge status: Home / Self Care | Payer: Medicare HMO | Source: Ambulatory Visit | Attending: Family Medicine | Admitting: Family Medicine

## 2022-09-05 DIAGNOSIS — N281 Cyst of kidney, acquired: Secondary | ICD-10-CM

## 2022-09-05 DIAGNOSIS — N2889 Other specified disorders of kidney and ureter: Secondary | ICD-10-CM | POA: Diagnosis not present

## 2022-09-10 DIAGNOSIS — B078 Other viral warts: Secondary | ICD-10-CM | POA: Diagnosis not present

## 2022-09-10 DIAGNOSIS — D225 Melanocytic nevi of trunk: Secondary | ICD-10-CM | POA: Diagnosis not present

## 2022-09-10 DIAGNOSIS — X32XXXA Exposure to sunlight, initial encounter: Secondary | ICD-10-CM | POA: Diagnosis not present

## 2022-09-10 DIAGNOSIS — L814 Other melanin hyperpigmentation: Secondary | ICD-10-CM | POA: Diagnosis not present

## 2022-09-10 DIAGNOSIS — L821 Other seborrheic keratosis: Secondary | ICD-10-CM | POA: Diagnosis not present

## 2022-09-10 DIAGNOSIS — L570 Actinic keratosis: Secondary | ICD-10-CM | POA: Diagnosis not present

## 2022-09-12 ENCOUNTER — Other Ambulatory Visit: Payer: Self-pay | Admitting: Family Medicine

## 2022-10-06 ENCOUNTER — Other Ambulatory Visit: Payer: Self-pay | Admitting: Family Medicine

## 2022-12-03 ENCOUNTER — Other Ambulatory Visit: Payer: Self-pay | Admitting: Family Medicine

## 2022-12-10 ENCOUNTER — Other Ambulatory Visit: Payer: Self-pay | Admitting: Family Medicine

## 2022-12-10 NOTE — Telephone Encounter (Signed)
Requested Prescriptions  Pending Prescriptions Disp Refills   omeprazole (PRILOSEC) 20 MG capsule [Pharmacy Med Name: OMEPRAZOLE DR 20 MG CAPSULE] 90 capsule 0    Sig: TAKE 1 CAPSULE BY MOUTH EVERY DAY     Gastroenterology: Proton Pump Inhibitors Failed - 12/10/2022  1:53 AM      Failed - Valid encounter within last 12 months    Recent Outpatient Visits           1 year ago Visit for suture removal   [facility] Pickard, Priscille Heidelberg, MD   1 year ago Leg skin lesion, right   [facility] Pickard, Priscille Heidelberg, MD   1 year ago Vertigo   [facility] Donita Brooks, MD   1 year ago Skin lesion of right leg   [facility] Donita Brooks, MD   1 year ago Concussion with loss of consciousness of 30 minutes or less, initial encounter   [facility] [place] [facility] Pickard, Priscille Heidelberg, MD

## 2022-12-30 ENCOUNTER — Other Ambulatory Visit: Payer: Self-pay | Admitting: Family Medicine

## 2022-12-30 DIAGNOSIS — Z1231 Encounter for screening mammogram for malignant neoplasm of breast: Secondary | ICD-10-CM

## 2023-01-08 ENCOUNTER — Ambulatory Visit
Admission: RE | Admit: 2023-01-08 | Discharge: 2023-01-08 | Disposition: A | Discharge status: Home / Self Care | Payer: Medicare HMO | Source: Ambulatory Visit | Attending: Family Medicine | Admitting: Family Medicine

## 2023-01-08 DIAGNOSIS — Z1231 Encounter for screening mammogram for malignant neoplasm of breast: Secondary | ICD-10-CM | POA: Diagnosis not present

## 2023-01-14 ENCOUNTER — Other Ambulatory Visit: Payer: Self-pay | Admitting: Family Medicine

## 2023-01-14 ENCOUNTER — Encounter: Payer: Self-pay | Admitting: Family Medicine

## 2023-01-14 DIAGNOSIS — R928 Other abnormal and inconclusive findings on diagnostic imaging of breast: Secondary | ICD-10-CM

## 2023-01-22 ENCOUNTER — Ambulatory Visit
Admission: RE | Admit: 2023-01-22 | Discharge: 2023-01-22 | Disposition: A | Discharge status: Home / Self Care | Payer: Medicare HMO | Source: Ambulatory Visit | Attending: Family Medicine | Admitting: Family Medicine

## 2023-01-22 DIAGNOSIS — N6001 Solitary cyst of right breast: Secondary | ICD-10-CM | POA: Diagnosis not present

## 2023-01-22 DIAGNOSIS — R928 Other abnormal and inconclusive findings on diagnostic imaging of breast: Secondary | ICD-10-CM

## 2023-01-29 ENCOUNTER — Other Ambulatory Visit: Payer: Self-pay | Admitting: Family Medicine

## 2023-02-13 ENCOUNTER — Ambulatory Visit (INDEPENDENT_AMBULATORY_CARE_PROVIDER_SITE_OTHER): Payer: Medicare HMO

## 2023-02-13 VITALS — Ht 62.0 in | Wt 164.0 lb

## 2023-02-13 DIAGNOSIS — Z0000 Encounter for general adult medical examination without abnormal findings: Secondary | ICD-10-CM

## 2023-02-13 NOTE — Patient Instructions (Signed)
[name] , Thank you for taking time to come for your Medicare Wellness Visit. I appreciate your ongoing commitment to your health goals. Please review the following plan we discussed and let me know if I can assist you in the future.   These are the goals we discussed:  Goals      Exercise 3x per week (30 min per time)     Continue to exercise and stay healthy. Travel more.        This is a list of the screening recommended for you and due dates:  Health Maintenance  Topic Date Due   DTaP/Tdap/Td vaccine (1 - Tdap) Never done   Zoster (Shingles) Vaccine (1 of 2) Never done   COVID-19 Vaccine (3 - Pfizer risk series) 06/07/2020   Flu Shot  03/20/2023   Colon Cancer Screening  06/20/2023   Medicare Annual Wellness Visit  02/13/2024   Mammogram  01/07/2025   Pneumonia Vaccine  Completed   DEXA scan (bone density measurement)  Completed   Hepatitis C Screening  Completed   HPV Vaccine  Aged Out    Advanced directives: Information on Advanced Care Planning can be found at [facility] Advance Health Care Directives Advance Health Care Directives ([email or web address] Please bring a copy of your health care power of attorney and living will to the office to be added to your chart at your convenience.   Conditions/risks identified: Aim for 30 minutes of exercise or brisk walking, 6-8 glasses of water, and 5 servings of fruits and vegetables each day.   Next appointment: Follow up in one year for your annual wellness visit    Preventive Care 65 Years and Older, Female Preventive care refers to lifestyle choices and visits with your health care provider that can promote health and wellness. What does preventive care include? A yearly physical exam. This is also called an annual well check. Dental exams once or twice a year. Routine eye exams. Ask your health care provider how often you should have your eyes checked. Personal lifestyle choices, including: Daily care  of your teeth and gums. Regular physical activity. Eating a healthy diet. Avoiding tobacco and drug use. Limiting alcohol use. Practicing safe sex. Taking low-dose aspirin every day. Taking vitamin and mineral supplements as recommended by your health care provider. What happens during an annual well check? The services and screenings done by your health care provider during your annual well check will depend on your age, overall health, lifestyle risk factors, and family history of disease. Counseling  Your health care provider may ask you questions about your: Alcohol use. Tobacco use. Drug use. Emotional well-being. Home and relationship well-being. Sexual activity. Eating habits. History of falls. Memory and ability to understand (cognition). Work and work Astronomer. Reproductive health. Screening  You may have the following tests or measurements: Height, weight, and BMI. Blood pressure. Lipid and cholesterol levels. These may be checked every 5 years, or more frequently if you are over 33 years old. Skin check. Lung cancer screening. You may have this screening every year starting at age 87 if you have a 30-pack-year history of smoking and currently smoke or have quit within the past 15 years. Fecal occult blood test (FOBT) of the stool. You may have this test every year starting at age 32. Flexible sigmoidoscopy or colonoscopy. You may have a sigmoidoscopy every 5 years or a colonoscopy every 10 years starting at age 75. Hepatitis C blood test. Hepatitis B blood test.  Sexually transmitted disease (STD) testing. Diabetes screening. This is done by checking your blood sugar (glucose) after you have not eaten for a while (fasting). You may have this done every 1-3 years. Bone density scan. This is done to screen for osteoporosis. You may have this done starting at age 56. Mammogram. This may be done every 1-2 years. Talk to your health care provider about how often you  should have regular mammograms. Talk with your health care provider about your test results, treatment options, and if necessary, the need for more tests. Vaccines  Your health care provider may recommend certain vaccines, such as: Influenza vaccine. This is recommended every year. Tetanus, diphtheria, and acellular pertussis (Tdap, Td) vaccine. You may need a Td booster every 10 years. Zoster vaccine. You may need this after age 37. Pneumococcal 13-valent conjugate (PCV13) vaccine. One dose is recommended after age 66. Pneumococcal polysaccharide (PPSV23) vaccine. One dose is recommended after age 38. Talk to your health care provider about which screenings and vaccines you need and how often you need them. This information is not intended to replace advice given to you by your health care provider. Make sure you discuss any questions you have with your health care provider. Document Released: 09/01/2015 Document Revised: 04/24/2016 Document Reviewed: 06/06/2015 Elsevier Interactive Patient Education  2017 [employer].  Fall Prevention in the Home Falls can cause injuries. They can happen to people of all ages. There are many things you can do to make your home safe and to help prevent falls. What can I do on the outside of my home? Regularly fix the edges of walkways and driveways and fix any cracks. Remove anything that might make you trip as you walk through a door, such as a raised step or threshold. Trim any bushes or trees on the path to your home. Use bright outdoor lighting. Clear any walking paths of anything that might make someone trip, such as rocks or tools. Regularly check to see if handrails are loose or broken. Make sure that both sides of any steps have handrails. Any raised decks and porches should have guardrails on the edges. Have any leaves, snow, or ice cleared regularly. Use sand or salt on walking paths during winter. Clean up any spills in your garage right away.  This includes oil or grease spills. What can I do in the bathroom? Use night lights. Install grab bars by the toilet and in the tub and shower. Do not use towel bars as grab bars. Use non-skid mats or decals in the tub or shower. If you need to sit down in the shower, use a plastic, non-slip stool. Keep the floor dry. Clean up any water that spills on the floor as soon as it happens. Remove soap buildup in the tub or shower regularly. Attach bath mats securely with double-sided non-slip rug tape. Do not have throw rugs and other things on the floor that can make you trip. What can I do in the bedroom? Use night lights. Make sure that you have a light by your bed that is easy to reach. Do not use any sheets or blankets that are too big for your bed. They should not hang down onto the floor. Have a firm chair that has side arms. You can use this for support while you get dressed. Do not have throw rugs and other things on the floor that can make you trip. What can I do in the kitchen? Clean up any spills right away.  Avoid walking on wet floors. Keep items that you use a lot in easy-to-reach places. If you need to reach something above you, use a strong step stool that has a grab bar. Keep electrical cords out of the way. Do not use floor polish or wax that makes floors slippery. If you must use wax, use non-skid floor wax. Do not have throw rugs and other things on the floor that can make you trip. What can I do with my stairs? Do not leave any items on the stairs. Make sure that there are handrails on both sides of the stairs and use them. Fix handrails that are broken or loose. Make sure that handrails are as long as the stairways. Check any carpeting to make sure that it is firmly attached to the stairs. Fix any carpet that is loose or worn. Avoid having throw rugs at the top or bottom of the stairs. If you do have throw rugs, attach them to the floor with carpet tape. Make sure that  you have a light switch at the top of the stairs and the bottom of the stairs. If you do not have them, ask someone to add them for you. What else can I do to help prevent falls? Wear shoes that: Do not have high heels. Have rubber bottoms. Are comfortable and fit you well. Are closed at the toe. Do not wear sandals. If you use a stepladder: Make sure that it is fully opened. Do not climb a closed stepladder. Make sure that both sides of the stepladder are locked into place. Ask someone to hold it for you, if possible. Clearly mark and make sure that you can see: Any grab bars or handrails. First and last steps. Where the edge of each step is. Use tools that help you move around (mobility aids) if they are needed. These include: Canes. Walkers. Scooters. Crutches. Turn on the lights when you go into a dark area. Replace any light bulbs as soon as they burn out. Set up your furniture so you have a clear path. Avoid moving your furniture around. If any of your floors are uneven, fix them. If there are any pets around you, be aware of where they are. Review your medicines with your doctor. Some medicines can make you feel dizzy. This can increase your chance of falling. Ask your doctor what other things that you can do to help prevent falls. This information is not intended to replace advice given to you by your health care provider. Make sure you discuss any questions you have with your health care provider. Document Released: 06/01/2009 Document Revised: 01/11/2016 Document Reviewed: 09/09/2014 Elsevier Interactive Patient Education  2017 [employer].

## 2023-02-13 NOTE — Progress Notes (Signed)
Subjective:   Alexa Allen is a 67 y.o. female who presents for Medicare Annual (Subsequent) preventive examination.  Visit Complete: Virtual  I connected with  Marlon Pel on 02/13/23 by a audio enabled telemedicine application and verified that I am speaking with the correct person using two identifiers.  Patient Location: Home  Provider Location: Home Office  I discussed the limitations of evaluation and management by telemedicine. The patient expressed understanding and agreed to proceed.  Patient Medicare AWV questionnaire was completed by the patient on 02/11/23; I have confirmed that all information answered by patient is correct and no changes since this date.  Review of Systems     Cardiac Risk Factors include: advanced age (>66men, >26 women);dyslipidemia;hypertension     Objective:    Today's Vitals   02/13/23 1222  Weight: 164 lb (74.4 kg)  Height: 5\' 2"  (1.575 m)   Body mass index is 30 kg/m.     02/13/2023   12:24 PM 02/07/2022    9:27 AM  Advanced Directives  Does Patient Have a Medical Advance Directive? No No  Would patient like information on creating a medical advance directive? Yes (MAU/Ambulatory/Procedural Areas - Information given) No - Patient declined    Current Medications (verified) Outpatient Encounter Medications as of 02/13/2023  Medication Sig   acyclovir (ZOVIRAX) 400 MG tablet TAKE 1 TABLET BY MOUTH EVERY DAY   alendronate (FOSAMAX) 70 MG tablet Take 1 tablet (70 mg total) by mouth every 7 (seven) days. Take with a full glass of water on an empty stomach.   atorvastatin (LIPITOR) 20 MG tablet Take 1 tablet (20 mg total) by mouth daily.   Multiple Vitamin (MULTIVITAMIN) tablet Take 1 tablet by mouth daily.   omeprazole (PRILOSEC) 20 MG capsule TAKE 1 CAPSULE BY MOUTH EVERY DAY   [DISCONTINUED] predniSONE (DELTASONE) 20 MG tablet Take 2 tablets (40 mg total) by mouth daily with breakfast.   No facility-administered encounter  medications on file as of 02/13/2023.    Allergies (verified) Iodinated contrast media and Oxycodone-acetaminophen   History: Past Medical History:  Diagnosis Date   Arthritis    hands   Elevated liver enzymes    GERD (gastroesophageal reflux disease)    HSV (herpes simplex virus) infection    Liver cyst    Osteoporosis    SVD (spontaneous vaginal delivery)    x 2   Wears partial dentures    upper   Past Surgical History:  Procedure Laterality Date   bladder tack     COLONOSCOPY     02/2008    MULTIPLE TOOTH EXTRACTIONS     TUBAL LIGATION     VAGINAL HYSTERECTOMY     WISDOM TOOTH EXTRACTION     Family History  Problem Relation Age of Onset   COPD Mother    Throat cancer Father    Colon cancer Neg Hx    Rectal cancer Neg Hx    Stomach cancer Neg Hx    Breast cancer Neg Hx    Social History   Socioeconomic History   Marital status: Married    Spouse name: Not on file   Number of children: 2   Years of education: Not on file   Highest education level: Not on file  Occupational History   Occupation: Control and instrumentation engineer  Tobacco Use   Smoking status: Former    Packs/day: .25    Types: Cigarettes   Smokeless tobacco: Never   Tobacco comments:    Quit 2011  Vaping  Use   Vaping Use: Never used  Substance and Sexual Activity   Alcohol use: Yes    Alcohol/week: 1.0 - 2.0 standard drink of alcohol    Types: 1 - 2 Cans of beer per week   Drug use: No   Sexual activity: Yes    Birth control/protection: Post-menopausal    Comment: Hysterectomy  Other Topics Concern   Not on file  Social History Narrative   Not on file   Social Determinants of Health   Financial Resource Strain: Low Risk  (02/13/2023)   Overall Financial Resource Strain (CARDIA)    Difficulty of Paying Living Expenses: Not hard at all  Food Insecurity: No Food Insecurity (02/13/2023)   Hunger Vital Sign    Worried About Running Out of Food in the Last Year: Never true    Ran Out of Food in the  Last Year: Never true  Transportation Needs: No Transportation Needs (02/13/2023)   PRAPARE - Administrator, Civil Service (Medical): No    Lack of Transportation (Non-Medical): No  Physical Activity: Insufficiently Active (02/13/2023)   Exercise Vital Sign    Days of Exercise per Week: 3 days    Minutes of Exercise per Session: 30 min  Stress: No Stress Concern Present (02/13/2023)   [employer] of Occupational Health - Occupational Stress Questionnaire    Feeling of Stress : Not at all  Social Connections: Unknown (02/13/2023)   Social Connection and Isolation Panel [NHANES]    Frequency of Communication with Friends and Family: More than three times a week    Frequency of Social Gatherings with Friends and Family: Twice a week    Attends Religious Services: 1 to 4 times per year    Active Member of [employer] or Organizations: Patient declined    Attends Banker Meetings: Patient declined    Marital Status: Married    Tobacco Counseling Counseling given: Not Answered Tobacco comments: Quit 2011   Clinical Intake:  Pre-visit preparation completed: Yes  Pain : No/denies pain     Diabetes: No  How often do you need to have someone help you when you read instructions, pamphlets, or other written materials from your doctor or pharmacy?: 1 - Never  Interpreter Needed?: No  Information entered by :: Kandis Fantasia LPN   Activities of Daily Living    02/13/2023   12:24 PM 02/11/2023   10:29 AM  In your present state of health, do you have any difficulty performing the following activities:  Hearing? 0 0  Vision? 0 0  Difficulty concentrating or making decisions? 0 0  Walking or climbing stairs? 0 0  Dressing or bathing? 0 0  Doing errands, shopping? 0 0  Preparing Food and eating ? N N  Using the Toilet? N N  In the past six months, have you accidently leaked urine? Y Y  Do you have problems with loss of bowel control? N N  Managing your  Medications? N N  Managing your Finances? N N  Housekeeping or managing your Housekeeping? N N    Patient Care Team: Donita Brooks, MD as PCP - General (Family Medicine) Blima Ledger, OD (Optometry) Nita Sells, MD (Dermatology)  Indicate any recent Medical Services you may have received from other than [facility] providers in the past year (date may be approximate).     Assessment:   This is a routine wellness examination for Kenzy.  Hearing/Vision screen Hearing Screening - Comments:: Denies hearing difficulties  Vision Screening - Comments:: Wears rx glasses - up to date with routine eye exams with Dr. Blima Ledger    Dietary issues and exercise activities discussed:     Goals Addressed             This Visit's Progress    Exercise 3x per week (30 min per time)   On track    Continue to exercise and stay healthy. Travel more.      Depression Screen    02/13/2023   12:23 PM 09/03/2022    2:54 PM 05/03/2022    2:06 PM 02/07/2022    9:22 AM 07/06/2020    9:24 AM 07/06/2019    3:22 PM 05/29/2018    8:24 AM  PHQ 2/9 Scores  PHQ - 2 Score 0 0 0 0 0 0 0  PHQ- 9 Score     0      Fall Risk    02/13/2023   12:23 PM 02/11/2023   10:29 AM 09/03/2022    2:54 PM 02/07/2022    9:28 AM 07/06/2020    9:23 AM  Fall Risk   Falls in the past year? 0 0 0 0 0  Number falls in past yr: 0 0 0 0 0  Injury with Fall? 0 0 0 0 0  Risk for fall due to : No Fall Risks  No Fall Risks No Fall Risks   Follow up Falls prevention discussed;Education provided;Falls evaluation completed  Falls prevention discussed Falls prevention discussed     MEDICARE RISK AT HOME:  Medicare Risk at Home - 02/13/23 1224     Any stairs in or around the home? Yes    If so, are there any without handrails? No    Home free of loose throw rugs in walkways, pet beds, electrical cords, etc? Yes    Adequate lighting in your home to reduce risk of falls? Yes    Life alert? No    Use of a cane, walker or  w/c? No    Grab bars in the bathroom? Yes    Shower chair or bench in shower? Yes    Elevated toilet seat or a handicapped toilet? No             TIMED UP AND GO:  Was the test performed?  No    Cognitive Function:        02/13/2023   12:24 PM 02/07/2022    9:32 AM  6CIT Screen  What Year? 0 points 0 points  What month? 0 points 0 points  What time? 0 points 0 points  Count back from 20 0 points 0 points  Months in reverse 0 points 0 points  Repeat phrase 0 points 0 points  Total Score 0 points 0 points    Immunizations Immunization History  Administered Date(s) Administered   Fluad Quad(high Dose 65+) 05/30/2021   Influenza,inj,Quad PF,6+ Mos 05/18/2014, 05/15/2016, 07/06/2020   Influenza-Unspecified 05/22/2018, 06/22/2019   PFIZER(Purple Top)SARS-COV-2 Vaccination 04/19/2020, 05/10/2020   PNEUMOCOCCAL CONJUGATE-20 03/20/2022   Zoster, Live 05/16/2016    TDAP status: Due, Education has been provided regarding the importance of this vaccine. Advised may receive this vaccine at local pharmacy or Health Dept. Aware to provide a copy of the vaccination record if obtained from local pharmacy or Health Dept. Verbalized acceptance and understanding.  Pneumococcal vaccine status: Up to date  Covid-19 vaccine status: Information provided on how to obtain vaccines.   Qualifies for Shingles Vaccine? Yes   Zostavax  completed Yes   Shingrix Completed?: No.    Education has been provided regarding the importance of this vaccine. Patient has been advised to call insurance company to determine out of pocket expense if they have not yet received this vaccine. Advised may also receive vaccine at local pharmacy or Health Dept. Verbalized acceptance and understanding.  Screening Tests Health Maintenance  Topic Date Due   DTaP/Tdap/Td (1 - Tdap) Never done   Zoster Vaccines- Shingrix (1 of 2) Never done   COVID-19 Vaccine (3 - Pfizer risk series) 06/07/2020   INFLUENZA VACCINE   03/20/2023   Colonoscopy  06/20/2023   Medicare Annual Wellness (AWV)  02/13/2024   MAMMOGRAM  01/07/2025   Pneumonia Vaccine 51+ Years old  Completed   DEXA SCAN  Completed   Hepatitis C Screening  Completed   HPV VACCINES  Aged Out    Health Maintenance  Health Maintenance Due  Topic Date Due   DTaP/Tdap/Td (1 - Tdap) Never done   Zoster Vaccines- Shingrix (1 of 2) Never done   COVID-19 Vaccine (3 - Pfizer risk series) 06/07/2020    Colorectal cancer screening: Type of screening: Colonoscopy. Completed 06/19/18. Repeat every 5 years  Mammogram status: Completed 01/08/23. Repeat every year  Bone Density status: Completed 04/02/22. Results reflect: Bone density results: OSTEOPOROSIS. Repeat every 2 years.  Lung Cancer Screening: (Low Dose CT Chest recommended if Age 49-80 years, 20 pack-year currently smoking OR have quit w/in 15years.) does not qualify.   Lung Cancer Screening Referral: n/a  Additional Screening:  Hepatitis C Screening: does qualify; Completed 103/16  Vision Screening: Recommended annual ophthalmology exams for early detection of glaucoma and other disorders of the eye. Is the patient up to date with their annual eye exam?  Yes  Who is the provider or what is the name of the office in which the patient attends annual eye exams? Dr. Blima Ledger If pt is not established with a provider, would they like to be referred to a provider to establish care? No .   Dental Screening: Recommended annual dental exams for proper oral hygiene  Community Resource Referral / Chronic Care Management: CRR required this visit?  No   CCM required this visit?  No     Plan:     I have personally reviewed and noted the following in the patient's chart:   Medical and social history Use of alcohol, tobacco or illicit drugs  Current medications and supplements including opioid prescriptions. Patient is not currently taking opioid prescriptions. Functional ability and  status Nutritional status Physical activity Advanced directives List of other physicians Hospitalizations, surgeries, and ER visits in previous 12 months Vitals Screenings to include cognitive, depression, and falls Referrals and appointments  In addition, I have reviewed and discussed with patient certain preventive protocols, quality metrics, and best practice recommendations. A written personalized care plan for preventive services as well as general preventive health recommendations were provided to patient.     Kandis Fantasia [place], [place]   11/25/8117   After Visit Summary: (MyChart) Due to this being a telephonic visit, the after visit summary with patients personalized plan was offered to patient via MyChart   Nurse Notes: No concerns

## 2023-02-25 DIAGNOSIS — L820 Inflamed seborrheic keratosis: Secondary | ICD-10-CM | POA: Diagnosis not present

## 2023-02-27 ENCOUNTER — Other Ambulatory Visit: Payer: Self-pay | Admitting: Family Medicine

## 2023-02-27 DIAGNOSIS — Z780 Asymptomatic menopausal state: Secondary | ICD-10-CM

## 2023-02-27 DIAGNOSIS — M810 Age-related osteoporosis without current pathological fracture: Secondary | ICD-10-CM

## 2023-02-27 NOTE — Telephone Encounter (Signed)
Requested Prescriptions  Pending Prescriptions Disp Refills   alendronate (FOSAMAX) 70 MG tablet [Pharmacy Med Name: ALENDRONATE SODIUM 70 MG TAB] 12 tablet 0    Sig: TAKE 1 TABLET (70 MG TOTAL) BY MOUTH EVERY 7 DAYS WITH FULL GLASS WATER ON EMPTY STOMACH     Endocrinology:  Bisphosphonates Failed - 02/27/2023  2:29 AM      Failed - Vitamin D in normal range and within 360 days    No results found for: "[id number]", "NW2956OZ3", "VD125OH2TOT", "25OHVITD3", "25OHVITD2", "25OHVITD1", "VD25OH"       Failed - Mg Level in normal range and within 360 days    No results found for: "MG"       Failed - Phosphate in normal range and within 360 days    No results found for: "PHOS"       Failed - Valid encounter within last 12 months    Recent Outpatient Visits           1 year ago Visit for suture removal   [facility] Donita Brooks, MD   1 year ago Leg skin lesion, right   [facility] Pickard, Priscille Heidelberg, MD   1 year ago Vertigo   [facility] Donita Brooks, MD   1 year ago Skin lesion of right leg   [facility] Tanya Nones, Priscille Heidelberg, MD   2 years ago Concussion with loss of consciousness of 30 minutes or less, initial encounter   [facility] Pickard, Priscille Heidelberg, MD              Passed - Ca in normal range and within 360 days    Calcium  Date Value Ref Range Status  08/05/2022 9.2 8.6 - 10.4 mg/dL Final         Passed - Cr in normal range and within 360 days    Creat  Date Value Ref Range Status  08/05/2022 0.70 0.50 - 1.05 mg/dL Final         Passed - eGFR is 30 or above and within 360 days    GFR, Est African American  Date Value Ref Range Status  01/10/2020 85 > OR = 60 mL/min/1.37m2 Final   GFR, Est Non African American  Date Value Ref Range Status  01/10/2020 73 > OR = 60 mL/min/1.20m2 Final   eGFR  Date Value Ref Range Status  08/05/2022 95 > OR = 60 mL/min/1.12m2 Final          Passed - Bone Mineral Density or Dexa Scan completed in the last 2 years

## 2023-02-28 ENCOUNTER — Other Ambulatory Visit: Payer: Self-pay | Admitting: Family Medicine

## 2023-02-28 NOTE — Telephone Encounter (Signed)
Requested Prescriptions  Pending Prescriptions Disp Refills   atorvastatin (LIPITOR) 20 MG tablet [Pharmacy Med Name: ATORVASTATIN 20 MG TABLET] 90 tablet 1    Sig: TAKE 1 TABLET BY MOUTH EVERY DAY     Cardiovascular:  Antilipid - Statins Failed - 02/28/2023  2:32 AM      Failed - Valid encounter within last 12 months    Recent Outpatient Visits           1 year ago Visit for suture removal   [facility] Pickard, Priscille Heidelberg, MD   1 year ago Leg skin lesion, right   [facility] Pickard, Priscille Heidelberg, MD   1 year ago Vertigo   [facility] Medicine Tanya Nones, Priscille Heidelberg, MD   1 year ago Skin lesion of right leg   [facility] Donita Brooks, MD   2 years ago Concussion with loss of consciousness of 30 minutes or less, initial encounter   [facility] Pickard, Priscille Heidelberg, MD              Failed - Lipid Panel in normal range within the last 12 months    Cholesterol  Date Value Ref Range Status  08/05/2022 154 <200 mg/dL Final   LDL Cholesterol (Calc)  Date Value Ref Range Status  08/05/2022 68 mg/dL (calc) Final    Comment:    Reference range: <100 . Desirable range <100 mg/dL for primary prevention;   <70 mg/dL for patients with CHD or diabetic patients  with > or = 2 CHD risk factors. Marland Kitchen LDL-C is now calculated using the Martin-Hopkins  calculation, which is a validated novel method providing  better accuracy than the Friedewald equation in the  estimation of LDL-C.  Horald Pollen et al. Lenox Ahr. 0981;191(47): 2061-2068  (http://education.[facility].com/faq/FAQ164)    HDL  Date Value Ref Range Status  08/05/2022 67 > OR = 50 mg/dL Final   Triglycerides  Date Value Ref Range Status  08/05/2022 100 <150 mg/dL Final         Passed - Patient is not pregnant

## 2023-03-07 ENCOUNTER — Other Ambulatory Visit: Payer: Self-pay | Admitting: Family Medicine

## 2023-03-07 NOTE — Telephone Encounter (Signed)
Requested Prescriptions  Pending Prescriptions Disp Refills   omeprazole (PRILOSEC) 20 MG capsule [Pharmacy Med Name: OMEPRAZOLE DR 20 MG CAPSULE] 90 capsule 1    Sig: TAKE 1 CAPSULE BY MOUTH EVERY DAY     Gastroenterology: Proton Pump Inhibitors Failed - 03/07/2023  6:16 AM      Failed - Valid encounter within last 12 months    Recent Outpatient Visits           1 year ago Visit for suture removal   [facility] Pickard, Priscille Heidelberg, MD   1 year ago Leg skin lesion, right   [facility] Pickard, Priscille Heidelberg, MD   1 year ago Vertigo   [facility] Donita Brooks, MD   1 year ago Skin lesion of right leg   [facility] Donita Brooks, MD   2 years ago Concussion with loss of consciousness of 30 minutes or less, initial encounter   [facility] Pickard, Priscille Heidelberg, MD

## 2023-03-17 ENCOUNTER — Ambulatory Visit (INDEPENDENT_AMBULATORY_CARE_PROVIDER_SITE_OTHER): Payer: Medicare HMO | Admitting: Family Medicine

## 2023-03-17 ENCOUNTER — Encounter: Payer: Self-pay | Admitting: Family Medicine

## 2023-03-17 VITALS — BP 120/80 | HR 69 | Temp 98.5°F | Ht 62.0 in | Wt 166.4 lb

## 2023-03-17 DIAGNOSIS — R109 Unspecified abdominal pain: Secondary | ICD-10-CM | POA: Diagnosis not present

## 2023-03-17 MED ORDER — CYCLOBENZAPRINE HCL 10 MG PO TABS: 10.0000 mg | ORAL_TABLET | Freq: Three times a day (TID) | ORAL | 0 refills | Status: DC | PRN

## 2023-03-17 NOTE — Progress Notes (Signed)
Subjective:    Patient ID: Alexa Allen, female    DOB: 06-Oct-1955, 67 y.o.   MRN: [id number]  Patient presents today with right flank pain.  She does have a history of an abnormal cyst on her left kidney seen on the CT scan in 2021 but the right kidney was normal.  The patient denies any injury.  She reports of pain in her right flank just below her scapula.  It grabs her and comes and goes.  It seems to be brought on by movement.  It hurts to twist.  It hurts to move her shoulder.  The pain seems to be like a spasm.  She is tender to palpation in the right flank.  There is no visible rash in that area.  There is no CVA tenderness she denies any dysuria or hematuria she denies nausea vomiting.  She denies any diarrhea or constipation.  She denies any cough or pleurisy or hemoptysis or purulent sputum.  Past Medical History:  Diagnosis Date   Arthritis    hands   Elevated liver enzymes    GERD (gastroesophageal reflux disease)    HSV (herpes simplex virus) infection    Liver cyst    Osteoporosis    SVD (spontaneous vaginal delivery)    x 2   Wears partial dentures    upper   Past Surgical History:  Procedure Laterality Date   bladder tack     COLONOSCOPY     02/2008    MULTIPLE TOOTH EXTRACTIONS     TUBAL LIGATION     VAGINAL HYSTERECTOMY     WISDOM TOOTH EXTRACTION     Current Outpatient Medications on File Prior to Visit  Medication Sig Dispense Refill   acyclovir (ZOVIRAX) 400 MG tablet TAKE 1 TABLET BY MOUTH EVERY DAY 60 tablet 1   alendronate (FOSAMAX) 70 MG tablet TAKE 1 TABLET (70 MG TOTAL) BY MOUTH EVERY 7 DAYS WITH FULL GLASS WATER ON EMPTY STOMACH 12 tablet 0   atorvastatin (LIPITOR) 20 MG tablet TAKE 1 TABLET BY MOUTH EVERY DAY 90 tablet 1   Multiple Vitamin (MULTIVITAMIN) tablet Take 1 tablet by mouth daily.     omeprazole (PRILOSEC) 20 MG capsule TAKE 1 CAPSULE BY MOUTH EVERY DAY 90 capsule 1   No current facility-administered medications on file prior to  visit.   Allergies  Allergen Reactions   Iodinated Contrast Media Hives    (Iohexol) hives with IV cm- tolerated 1 hour quick prep well, Onset Date: [id number]   Oxycodone-Acetaminophen Other (See Comments)    Hallucinations and itching   Social History   Socioeconomic History   Marital status: Married    Spouse name: Not on file   Number of children: 2   Years of education: Not on file   Highest education level: Not on file  Occupational History   Occupation: Control and instrumentation engineer  Tobacco Use   Smoking status: Former    Current packs/day: 0.25    Types: Cigarettes   Smokeless tobacco: Never   Tobacco comments:    Quit 2011  Vaping Use   Vaping status: Never Used  Substance and Sexual Activity   Alcohol use: Yes    Alcohol/week: 1.0 - 2.0 standard drink of alcohol    Types: 1 - 2 Cans of beer per week   Drug use: No   Sexual activity: Yes    Birth control/protection: Post-menopausal    Comment: Hysterectomy  Other Topics Concern   Not on file  Social  History Narrative   Not on file   Social Determinants of Health   Financial Resource Strain: Low Risk  (02/13/2023)   Overall Financial Resource Strain (CARDIA)    Difficulty of Paying Living Expenses: Not hard at all  Food Insecurity: No Food Insecurity (02/13/2023)   Hunger Vital Sign    Worried About Running Out of Food in the Last Year: Never true    Ran Out of Food in the Last Year: Never true  Transportation Needs: No Transportation Needs (02/13/2023)   PRAPARE - Administrator, Civil Service (Medical): No    Lack of Transportation (Non-Medical): No  Physical Activity: Insufficiently Active (02/13/2023)   Exercise Vital Sign    Days of Exercise per Week: 3 days    Minutes of Exercise per Session: 30 min  Stress: No Stress Concern Present (02/13/2023)   [employer] of Occupational Health - Occupational Stress Questionnaire    Feeling of Stress : Not at all  Social Connections: Unknown (02/13/2023)    Social Connection and Isolation Panel [NHANES]    Frequency of Communication with Friends and Family: More than three times a week    Frequency of Social Gatherings with Friends and Family: Twice a week    Attends Religious Services: 1 to 4 times per year    Active Member of [employer] or Organizations: Patient declined    Attends Banker Meetings: Patient declined    Marital Status: Married  Catering manager Violence: Not At Risk (02/13/2023)   Humiliation, Afraid, Rape, and Kick questionnaire    Fear of Current or Ex-Partner: No    Emotionally Abused: No    Physically Abused: No    Sexually Abused: No     Review of Systems  All other systems reviewed and are negative.      Objective:   Physical Exam Vitals reviewed.  Constitutional:      General: She is not in acute distress.    Appearance: Normal appearance. She is normal weight. She is not ill-appearing or toxic-appearing.  HENT:     Head: Normocephalic and atraumatic. No left periorbital erythema.     Jaw: There is normal jaw occlusion.     Nose:     Right Sinus: Frontal sinus tenderness present.     Left Sinus: Frontal sinus tenderness present.  Eyes:     General: Lids are everted, no foreign bodies appreciated. Vision grossly intact. Gaze aligned appropriately.        Right eye: No foreign body.        Left eye: No foreign body.     Extraocular Movements: Extraocular movements intact.     Right eye: Normal extraocular motion.     Left eye: Normal extraocular motion.     Conjunctiva/sclera:     Right eye: Right conjunctiva is not injected. No chemosis, exudate or hemorrhage.    Left eye: Left conjunctiva is not injected. No chemosis, exudate or hemorrhage.    Pupils: Pupils are equal, round, and reactive to light.  Cardiovascular:     Rate and Rhythm: Normal rate and regular rhythm.     Heart sounds: Normal heart sounds. No murmur heard.    No friction rub. No gallop.  Pulmonary:     Effort: Pulmonary  effort is normal. No respiratory distress.     Breath sounds: Normal breath sounds. No wheezing, rhonchi or rales.  Abdominal:     General: Abdomen is flat. Bowel sounds are normal. There is no distension.  Palpations: Abdomen is soft.     Tenderness: There is no abdominal tenderness. There is no guarding or rebound.  Musculoskeletal:     Thoracic back: Spasms and tenderness present. Decreased range of motion.     Lumbar back: Spasms and tenderness present. Decreased range of motion.       Back:     Right lower leg: No edema.     Left lower leg: No edema.  Neurological:     General: No focal deficit present.     Mental Status: She is alert and oriented to person, place, and time. Mental status is at baseline.     Cranial Nerves: No cranial nerve deficit.     Motor: No weakness.     Gait: Gait normal.           Assessment & Plan:  Right flank pain I believe the pain in her right flank is due to muscle spasm and a muscle strain.  There is no evidence of any tenderness to the spinous processes in the thoracic or lumbar spine.  There is no CVA tenderness.  There is no abdominal tenderness.  History does not support a urinary tract infection or pneumonia or an intestinal infection.  I do not believe that I would be able to reproduce the pain of the kidney stone by palpation in the right flank.  Therefore I will treat the patient with Flexeril 10 mg every 8 hours as needed.  Recheck over the next 48 hours.  If not improving, I will obtain x-rays of the back and imaging of the right kidney

## 2023-04-29 ENCOUNTER — Encounter: Payer: Self-pay | Admitting: Family Medicine

## 2023-04-29 ENCOUNTER — Ambulatory Visit (INDEPENDENT_AMBULATORY_CARE_PROVIDER_SITE_OTHER): Payer: Medicare HMO | Admitting: Family Medicine

## 2023-04-29 VITALS — BP 122/64 | HR 73 | Temp 98.2°F | Ht 62.0 in | Wt 168.0 lb

## 2023-04-29 DIAGNOSIS — B9689 Other specified bacterial agents as the cause of diseases classified elsewhere: Secondary | ICD-10-CM | POA: Diagnosis not present

## 2023-04-29 DIAGNOSIS — J019 Acute sinusitis, unspecified: Secondary | ICD-10-CM | POA: Diagnosis not present

## 2023-04-29 MED ORDER — AMOXICILLIN-POT CLAVULANATE 875-125 MG PO TABS: 1.0000 | ORAL_TABLET | Freq: Two times a day (BID) | ORAL | 0 refills | Status: DC

## 2023-04-29 MED ORDER — HYDROCODONE BIT-HOMATROP MBR 5-1.5 MG/5ML PO SOLN: 5.0000 mL | Freq: Three times a day (TID) | ORAL | 0 refills | Status: DC | PRN

## 2023-04-29 NOTE — Progress Notes (Signed)
Subjective:  HPI: Alexa Allen is a 67 y.o. female presenting on 04/29/2023 for Acute Visit (sinus infection/head cold, bad cough with green mucous; OTC meds not helping)   HPI Patient is in today for mucopurulent cough that is worse at night, sinus congestion, mucopurulent rhinorrhea, and ear pain since last early last week. Denies fever, shortness of breath, wheezing, loss of taste or smell Has tried Mucinex, Alka Seltzer Plus No known sick exposures or home covid testing  Review of Systems  All other systems reviewed and are negative.   Relevant past medical history reviewed and updated as indicated.   Past Medical History:  Diagnosis Date   Arthritis    hands   Elevated liver enzymes    GERD (gastroesophageal reflux disease)    HSV (herpes simplex virus) infection    Liver cyst    Osteoporosis    SVD (spontaneous vaginal delivery)    x 2   Wears partial dentures    upper     Past Surgical History:  Procedure Laterality Date   bladder tack     COLONOSCOPY     02/2008    MULTIPLE TOOTH EXTRACTIONS     TUBAL LIGATION     VAGINAL HYSTERECTOMY     WISDOM TOOTH EXTRACTION      Allergies and medications reviewed and updated.   Current Outpatient Medications:    acyclovir (ZOVIRAX) 400 MG tablet, TAKE 1 TABLET BY MOUTH EVERY DAY, Disp: 60 tablet, Rfl: 1   alendronate (FOSAMAX) 70 MG tablet, TAKE 1 TABLET (70 MG TOTAL) BY MOUTH EVERY 7 DAYS WITH FULL GLASS WATER ON EMPTY STOMACH, Disp: 12 tablet, Rfl: 0   amoxicillin-clavulanate (AUGMENTIN) 875-125 MG tablet, Take 1 tablet by mouth 2 (two) times daily., Disp: 20 tablet, Rfl: 0   atorvastatin (LIPITOR) 20 MG tablet, TAKE 1 TABLET BY MOUTH EVERY DAY, Disp: 90 tablet, Rfl: 1   HYDROcodone bit-homatropine (HYCODAN) 5-1.5 MG/5ML syrup, Take 5 mLs by mouth every 8 (eight) hours as needed for cough., Disp: 120 mL, Rfl: 0   Multiple Vitamin (MULTIVITAMIN) tablet, Take 1 tablet by mouth daily., Disp: , Rfl:    omeprazole  (PRILOSEC) 20 MG capsule, TAKE 1 CAPSULE BY MOUTH EVERY DAY, Disp: 90 capsule, Rfl: 1   cyclobenzaprine (FLEXERIL) 10 MG tablet, Take 1 tablet (10 mg total) by mouth 3 (three) times daily as needed for muscle spasms. (Patient not taking: Reported on 04/29/2023), Disp: 30 tablet, Rfl: 0  Allergies  Allergen Reactions   Iodinated Contrast Media Hives    (Iohexol) hives with IV cm- tolerated 1 hour quick prep well, Onset Date: [id number]   Oxycodone-Acetaminophen Other (See Comments)    Hallucinations and itching    Objective:   BP 122/64   Pulse 73   Temp 98.2 F (36.8 C) (Oral)   Ht 5\' 2"  (1.575 m)   Wt 168 lb (76.2 kg)   LMP  (LMP Unknown)   SpO2 97%   BMI 30.73 kg/m      04/29/2023    2:04 PM 03/17/2023    4:10 PM 02/13/2023   12:22 PM  Vitals with BMI  Height 5\' 2"  5\' 2"  5\' 2"   Weight 168 lbs 166 lbs 6 oz 164 lbs  BMI 30.72 30.43 29.99  Systolic 122 120 --  Diastolic 64 80 --  Pulse 73 69      Physical Exam Vitals and nursing note reviewed.  Constitutional:      Appearance: Normal appearance. She is normal weight.  HENT:     Head: Normocephalic and atraumatic.     Right Ear: Tympanic membrane, ear canal and external ear normal.     Left Ear: Tympanic membrane, ear canal and external ear normal.     Nose: Congestion present.     Right Sinus: Frontal sinus tenderness present.     Mouth/Throat:     Mouth: Mucous membranes are moist.     Pharynx: Oropharynx is clear.  Eyes:     Conjunctiva/sclera: Conjunctivae normal.  Cardiovascular:     Rate and Rhythm: Normal rate and regular rhythm.     Pulses: Normal pulses.     Heart sounds: Normal heart sounds.  Pulmonary:     Effort: Pulmonary effort is normal.     Breath sounds: Normal breath sounds.  Musculoskeletal:     Cervical back: No tenderness.  Lymphadenopathy:     Cervical: No cervical adenopathy.  Skin:    General: Skin is warm and dry.  Neurological:     General: No focal deficit present.     Mental  Status: She is alert and oriented to person, place, and time. Mental status is at baseline.  Psychiatric:        Mood and Affect: Mood normal.        Behavior: Behavior normal.        Thought Content: Thought content normal.        Judgment: Judgment normal.     Assessment & Plan:  Acute bacterial sinusitis Assessment & Plan: Symptoms are consistent with bacterial sinusitis with >7 days of unilateral frontal sinus congestion and mucopurulent rhinorrhea. Will treat with Augmentin 875-125 BID for 10 days and also provide Hycodan PRN for cough that is keeping her up at night. Advised to use with caution as this may cause drowsiness. Return to office if symptoms persist or worsen.   Other orders -     Amoxicillin-Pot Clavulanate; Take 1 tablet by mouth 2 (two) times daily.  Dispense: 20 tablet; Refill: 0 -     HYDROcodone Bit-Homatrop MBr; Take 5 mLs by mouth every 8 (eight) hours as needed for cough.  Dispense: 120 mL; Refill: 0     Follow up plan: Return if symptoms worsen or fail to improve.  Park Meo, FNP

## 2023-04-29 NOTE — Assessment & Plan Note (Signed)
Symptoms are consistent with bacterial sinusitis with >7 days of unilateral frontal sinus congestion and mucopurulent rhinorrhea. Will treat with Augmentin 875-125 BID for 10 days and also provide Hycodan PRN for cough that is keeping her up at night. Advised to use with caution as this may cause drowsiness. Return to office if symptoms persist or worsen.

## 2023-05-18 ENCOUNTER — Other Ambulatory Visit: Payer: Self-pay | Admitting: Family Medicine

## 2023-05-18 DIAGNOSIS — M810 Age-related osteoporosis without current pathological fracture: Secondary | ICD-10-CM

## 2023-05-18 DIAGNOSIS — Z780 Asymptomatic menopausal state: Secondary | ICD-10-CM

## 2023-05-19 NOTE — Telephone Encounter (Signed)
Requested medication (s) are due for refill today: yes  Requested medication (s) are on the active medication list: yes  Last refill:  02/27/23  Future visit scheduled: no  Notes to clinic:  Unable to refill per protocol due to failed labs, no updated results.      Requested Prescriptions  Pending Prescriptions Disp Refills   alendronate (FOSAMAX) 70 MG tablet [Pharmacy Med Name: ALENDRONATE SODIUM 70 MG TAB] 12 tablet 0    Sig: TAKE 1 TABLET (70 MG TOTAL) BY MOUTH EVERY 7 DAYS WITH FULL GLASS WATER ON EMPTY STOMACH     Endocrinology:  Bisphosphonates Failed - 05/18/2023  8:35 AM      Failed - Vitamin D in normal range and within 360 days    No results found for: "ZO1096EA5", "WU9811BJ4", "VD125OH2TOT", "25OHVITD3", "25OHVITD2", "25OHVITD1", "VD25OH"       Failed - Mg Level in normal range and within 360 days    No results found for: "MG"       Failed - Phosphate in normal range and within 360 days    No results found for: "PHOS"       Failed - Valid encounter within last 12 months    Recent Outpatient Visits           1 year ago Visit for suture removal   [facility] Donita Brooks, MD   1 year ago Leg skin lesion, right   [facility] Pickard, Priscille Heidelberg, MD   1 year ago Vertigo   [facility] Donita Brooks, MD   2 years ago Skin lesion of right leg   [facility] Tanya Nones, Priscille Heidelberg, MD   2 years ago Concussion with loss of consciousness of 30 minutes or less, initial encounter   [facility] Pickard, Priscille Heidelberg, MD              Passed - Ca in normal range and within 360 days    Calcium  Date Value Ref Range Status  08/05/2022 9.2 8.6 - 10.4 mg/dL Final         Passed - Cr in normal range and within 360 days    Creat  Date Value Ref Range Status  08/05/2022 0.70 0.50 - 1.05 mg/dL Final         Passed - eGFR is 30 or above and within 360 days    GFR, Est African American   Date Value Ref Range Status  01/10/2020 85 > OR = 60 mL/min/1.22m2 Final   GFR, Est Non African American  Date Value Ref Range Status  01/10/2020 73 > OR = 60 mL/min/1.58m2 Final   eGFR  Date Value Ref Range Status  08/05/2022 95 > OR = 60 mL/min/1.29m2 Final         Passed - Bone Mineral Density or Dexa Scan completed in the last 2 years

## 2023-05-23 ENCOUNTER — Other Ambulatory Visit: Payer: Self-pay | Admitting: Family Medicine

## 2023-05-23 NOTE — Telephone Encounter (Signed)
Requested Prescriptions  Pending Prescriptions Disp Refills   acyclovir (ZOVIRAX) 400 MG tablet [Pharmacy Med Name: ACYCLOVIR 400 MG TABLET] 60 tablet 1    Sig: TAKE 1 TABLET BY MOUTH EVERY DAY     Antimicrobials:  Antiviral Agents - Anti-Herpetic Failed - 05/23/2023  1:39 AM      Failed - Valid encounter within last 12 months    Recent Outpatient Visits           1 year ago Visit for suture removal   [facility] Pickard, Priscille Heidelberg, MD   1 year ago Leg skin lesion, right   [facility] Pickard, Priscille Heidelberg, MD   1 year ago Vertigo   [facility] Donita Brooks, MD   2 years ago Skin lesion of right leg   [facility] Donita Brooks, MD   2 years ago Concussion with loss of consciousness of 30 minutes or less, initial encounter   [facility] Pickard, Priscille Heidelberg, MD

## 2023-05-27 ENCOUNTER — Other Ambulatory Visit: Payer: Self-pay | Admitting: Family Medicine

## 2023-05-27 NOTE — Telephone Encounter (Signed)
Too soon- reordered on 02/28/23 #90 1 RF  Requested Prescriptions  Refused Prescriptions Disp Refills   atorvastatin (LIPITOR) 20 MG tablet [Pharmacy Med Name: ATORVASTATIN 20 MG TABLET] 90 tablet 1    Sig: TAKE 1 TABLET BY MOUTH EVERY DAY     Cardiovascular:  Antilipid - Statins Failed - 05/27/2023 10:17 AM      Failed - Valid encounter within last 12 months    Recent Outpatient Visits           1 year ago Visit for suture removal   [facility] Pickard, Priscille Heidelberg, MD   1 year ago Leg skin lesion, right   [facility] Pickard, Priscille Heidelberg, MD   1 year ago Vertigo   [facility] Donita Brooks, MD   2 years ago Skin lesion of right leg   [facility] Donita Brooks, MD   2 years ago Concussion with loss of consciousness of 30 minutes or less, initial encounter   [facility] Pickard, Priscille Heidelberg, MD              Failed - Lipid Panel in normal range within the last 12 months    Cholesterol  Date Value Ref Range Status  08/05/2022 154 <200 mg/dL Final   LDL Cholesterol (Calc)  Date Value Ref Range Status  08/05/2022 68 mg/dL (calc) Final    Comment:    Reference range: <100 . Desirable range <100 mg/dL for primary prevention;   <70 mg/dL for patients with CHD or diabetic patients  with > or = 2 CHD risk factors. Marland Kitchen LDL-C is now calculated using the Martin-Hopkins  calculation, which is a validated novel method providing  better accuracy than the Friedewald equation in the  estimation of LDL-C.  Horald Pollen et al. Lenox Ahr. 7322;025(42): 2061-2068  (http://education.[facility].com/faq/FAQ164)    HDL  Date Value Ref Range Status  08/05/2022 67 > OR = 50 mg/dL Final   Triglycerides  Date Value Ref Range Status  08/05/2022 100 <150 mg/dL Final         Passed - Patient is not pregnant

## 2023-06-18 DIAGNOSIS — B007 Disseminated herpesviral disease: Secondary | ICD-10-CM | POA: Diagnosis not present

## 2023-06-18 DIAGNOSIS — E785 Hyperlipidemia, unspecified: Secondary | ICD-10-CM | POA: Diagnosis not present

## 2023-06-18 DIAGNOSIS — I10 Essential (primary) hypertension: Secondary | ICD-10-CM | POA: Diagnosis not present

## 2023-06-18 DIAGNOSIS — Z91041 Radiographic dye allergy status: Secondary | ICD-10-CM | POA: Diagnosis not present

## 2023-06-18 DIAGNOSIS — M810 Age-related osteoporosis without current pathological fracture: Secondary | ICD-10-CM | POA: Diagnosis not present

## 2023-06-18 DIAGNOSIS — Z809 Family history of malignant neoplasm, unspecified: Secondary | ICD-10-CM | POA: Diagnosis not present

## 2023-06-18 DIAGNOSIS — Z885 Allergy status to narcotic agent status: Secondary | ICD-10-CM | POA: Diagnosis not present

## 2023-06-18 DIAGNOSIS — I700 Atherosclerosis of aorta: Secondary | ICD-10-CM | POA: Diagnosis not present

## 2023-06-18 DIAGNOSIS — Z7983 Long term (current) use of bisphosphonates: Secondary | ICD-10-CM | POA: Diagnosis not present

## 2023-06-18 DIAGNOSIS — Z87891 Personal history of nicotine dependence: Secondary | ICD-10-CM | POA: Diagnosis not present

## 2023-06-18 DIAGNOSIS — I739 Peripheral vascular disease, unspecified: Secondary | ICD-10-CM | POA: Diagnosis not present

## 2023-06-18 DIAGNOSIS — M1990 Unspecified osteoarthritis, unspecified site: Secondary | ICD-10-CM | POA: Diagnosis not present

## 2023-06-24 ENCOUNTER — Encounter: Payer: Self-pay | Admitting: Internal Medicine

## 2023-07-03 ENCOUNTER — Ambulatory Visit: Payer: Medicare HMO

## 2023-07-03 ENCOUNTER — Encounter: Payer: Self-pay | Admitting: Family Medicine

## 2023-07-03 ENCOUNTER — Ambulatory Visit (INDEPENDENT_AMBULATORY_CARE_PROVIDER_SITE_OTHER): Payer: Medicare HMO | Admitting: Family Medicine

## 2023-07-03 VITALS — BP 118/78 | HR 76 | Temp 98.8°F | Ht 62.0 in | Wt 161.5 lb

## 2023-07-03 DIAGNOSIS — Z23 Encounter for immunization: Secondary | ICD-10-CM | POA: Diagnosis not present

## 2023-07-03 DIAGNOSIS — E7800 Pure hypercholesterolemia, unspecified: Secondary | ICD-10-CM

## 2023-07-03 DIAGNOSIS — Z0001 Encounter for general adult medical examination with abnormal findings: Secondary | ICD-10-CM

## 2023-07-03 DIAGNOSIS — M810 Age-related osteoporosis without current pathological fracture: Secondary | ICD-10-CM

## 2023-07-03 DIAGNOSIS — Z0000 Encounter for general adult medical examination without abnormal findings: Secondary | ICD-10-CM

## 2023-07-03 NOTE — Progress Notes (Signed)
Subjective:    Patient ID: Alexa Allen, female    DOB: 08-28-1955, 67 y.o.   MRN: [id number]  HPI Patient is a very pleasant 67 year old Caucasian female who is here today for complete physical exam.  Her last colonoscopy was in May 2023.  She no longer needs a Pap smear as she has a history of TAH/BSO.  Patient had a colonoscopy in 2019.  It is due to be repeated this year.  She will call and schedule this.  Her mammogram was performed in June and was normal.  She had a bone density test in 2023 which revealed osteoporosis in her forearm.  She is currently taking calcium, vitamin D, and Fosamax.  She is due for a flu shot.  She is due for a COVID shot.  We discussed RSV shingles.  She agrees to get the flu shot today.  She defers the other vaccinations.  Otherwise she is doing well.  She denies any falls, depression, or memory loss.   Immunization History  Administered Date(s) Administered   Fluad Quad(high Dose 65+) 05/30/2021   Influenza,inj,Quad PF,6+ Mos 05/18/2014, 05/15/2016, 07/06/2020   Influenza-Unspecified 05/22/2018, 06/22/2019   PFIZER(Purple Top)SARS-COV-2 Vaccination 04/19/2020, 05/10/2020   PNEUMOCOCCAL CONJUGATE-20 03/20/2022   Zoster, Live 05/16/2016    Past Medical History:  Diagnosis Date   Arthritis    hands   Elevated liver enzymes    GERD (gastroesophageal reflux disease)    HSV (herpes simplex virus) infection    Liver cyst    Osteoporosis    SVD (spontaneous vaginal delivery)    x 2   Wears partial dentures    upper   Past Surgical History:  Procedure Laterality Date   bladder tack     COLONOSCOPY     02/2008    MULTIPLE TOOTH EXTRACTIONS     TUBAL LIGATION     VAGINAL HYSTERECTOMY     WISDOM TOOTH EXTRACTION     Current Outpatient Medications on File Prior to Visit  Medication Sig Dispense Refill   acyclovir (ZOVIRAX) 400 MG tablet TAKE 1 TABLET BY MOUTH EVERY DAY 60 tablet 1   alendronate (FOSAMAX) 70 MG tablet TAKE 1 TABLET (70 MG TOTAL)  BY MOUTH EVERY 7 DAYS WITH FULL GLASS WATER ON EMPTY STOMACH 12 tablet 0   amoxicillin-clavulanate (AUGMENTIN) 875-125 MG tablet Take 1 tablet by mouth 2 (two) times daily. 20 tablet 0   atorvastatin (LIPITOR) 20 MG tablet TAKE 1 TABLET BY MOUTH EVERY DAY 90 tablet 1   cyclobenzaprine (FLEXERIL) 10 MG tablet Take 1 tablet (10 mg total) by mouth 3 (three) times daily as needed for muscle spasms. (Patient not taking: Reported on 04/29/2023) 30 tablet 0   HYDROcodone bit-homatropine (HYCODAN) 5-1.5 MG/5ML syrup Take 5 mLs by mouth every 8 (eight) hours as needed for cough. 120 mL 0   Multiple Vitamin (MULTIVITAMIN) tablet Take 1 tablet by mouth daily.     omeprazole (PRILOSEC) 20 MG capsule TAKE 1 CAPSULE BY MOUTH EVERY DAY 90 capsule 1   No current facility-administered medications on file prior to visit.   Allergies  Allergen Reactions   Iodinated Contrast Media Hives    (Iohexol) hives with IV cm- tolerated 1 hour quick prep well, Onset Date: [id number]   Oxycodone-Acetaminophen Other (See Comments)    Hallucinations and itching   Social History   Socioeconomic History   Marital status: Married    Spouse name: Not on file   Number of children: 2   Years of  education: Not on file   Highest education level: GED or equivalent  Occupational History   Occupation: Control and instrumentation engineer  Tobacco Use   Smoking status: Former    Current packs/day: 0.25    Types: Cigarettes   Smokeless tobacco: Never   Tobacco comments:    Quit 2011  Vaping Use   Vaping status: Never Used  Substance and Sexual Activity   Alcohol use: Yes    Alcohol/week: 1.0 - 2.0 standard drink of alcohol    Types: 1 - 2 Cans of beer per week   Drug use: No   Sexual activity: Yes    Birth control/protection: Post-menopausal    Comment: Hysterectomy  Other Topics Concern   Not on file  Social History Narrative   Not on file   Social Determinants of Health   Financial Resource Strain: Low Risk  (06/30/2023)   Overall  Financial Resource Strain (CARDIA)    Difficulty of Paying Living Expenses: Not hard at all  Food Insecurity: No Food Insecurity (06/30/2023)   Hunger Vital Sign    Worried About Running Out of Food in the Last Year: Never true    Ran Out of Food in the Last Year: Never true  Transportation Needs: No Transportation Needs (06/30/2023)   PRAPARE - Administrator, Civil Service (Medical): No    Lack of Transportation (Non-Medical): No  Physical Activity: Insufficiently Active (06/30/2023)   Exercise Vital Sign    Days of Exercise per Week: 2 days    Minutes of Exercise per Session: 10 min  Stress: No Stress Concern Present (06/30/2023)   [employer] of Occupational Health - Occupational Stress Questionnaire    Feeling of Stress : Not at all  Social Connections: Moderately Integrated (06/30/2023)   Social Connection and Isolation Panel [NHANES]    Frequency of Communication with Friends and Family: More than three times a week    Frequency of Social Gatherings with Friends and Family: Once a week    Attends Religious Services: More than 4 times per year    Active Member of [employer] or Organizations: No    Attends Banker Meetings: Patient declined    Marital Status: Married  Catering manager Violence: Not At Risk (02/13/2023)   Humiliation, Afraid, Rape, and Kick questionnaire    Fear of Current or Ex-Partner: No    Emotionally Abused: No    Physically Abused: No    Sexually Abused: No     Review of Systems  All other systems reviewed and are negative.      Objective:   Physical Exam Vitals reviewed.  Constitutional:      General: She is not in acute distress.    Appearance: She is well-developed. She is not diaphoretic.  HENT:     Head: Normocephalic and atraumatic.     Right Ear: External ear normal.     Left Ear: External ear normal.     Nose: Nose normal.     Mouth/Throat:     Pharynx: No oropharyngeal exudate.  Eyes:     General: No  scleral icterus.       Right eye: No discharge.        Left eye: No discharge.     Conjunctiva/sclera: Conjunctivae normal.     Pupils: Pupils are equal, round, and reactive to light.  Neck:     Thyroid: No thyromegaly.     Vascular: No JVD.     Trachea: No tracheal deviation.  Cardiovascular:  Rate and Rhythm: Normal rate and regular rhythm.     Heart sounds: Normal heart sounds. No murmur heard.    No friction rub. No gallop.  Pulmonary:     Effort: Pulmonary effort is normal. No respiratory distress.     Breath sounds: Normal breath sounds. No stridor. No wheezing or rales.  Chest:     Chest wall: No tenderness.  Abdominal:     General: Bowel sounds are normal. There is no distension.     Palpations: Abdomen is soft. There is no mass.     Tenderness: There is no abdominal tenderness. There is no guarding or rebound.  Musculoskeletal:        General: No tenderness. Normal range of motion.     Cervical back: Normal range of motion and neck supple.  Lymphadenopathy:     Cervical: No cervical adenopathy.  Skin:    General: Skin is warm.     Coloration: Skin is not pale.     Findings: No erythema or rash.  Neurological:     Mental Status: She is alert and oriented to person, place, and time.     Cranial Nerves: No cranial nerve deficit.     Motor: No abnormal muscle tone.     Coordination: Coordination normal.     Deep Tendon Reflexes: Reflexes are normal and symmetric.  Psychiatric:        Behavior: Behavior normal.        Thought Content: Thought content normal.        Judgment: Judgment normal.         Assessment & Plan:  Pure hypercholesterolemia - Plan: CBC with Differential/Platelet, COMPLETE METABOLIC PANEL WITH GFR, Lipid panel  General medical exam  Osteoporosis without current pathological fracture, unspecified osteoporosis type Physical exam today is normal.  Blood pressure is excellent.  Patient said her flu shot today.  She defers a COVID vaccination  vaccine.  We discussed RSV and she does not feel that she needs this.  Check CBC, CMP, and fasting lipid panel.  Goal LDL cholesterol is less than 100.  Patient will call and schedule her colonoscopy.  Mammogram and bone density test are up-to-date.  Encouraged her to use calcium and vitamin D.  Patient for a Pap smear

## 2023-07-04 LAB — CBC WITH DIFFERENTIAL/PLATELET
Absolute Lymphocytes: 1752 {cells}/uL (ref 850–3900)
Absolute Monocytes: 489 {cells}/uL (ref 200–950)
Basophils Absolute: 62 {cells}/uL (ref 0–200)
Basophils Relative: 1.2 %
Eosinophils Absolute: 182 {cells}/uL (ref 15–500)
Eosinophils Relative: 3.5 %
HCT: 43.3 % (ref 35.0–45.0)
Hemoglobin: 14.6 g/dL (ref 11.7–15.5)
MCH: 30.1 pg (ref 27.0–33.0)
MCHC: 33.7 g/dL (ref 32.0–36.0)
MCV: 89.3 fL (ref 80.0–100.0)
MPV: 12 fL (ref 7.5–12.5)
Monocytes Relative: 9.4 %
Neutro Abs: 2714 {cells}/uL (ref 1500–7800)
Neutrophils Relative %: 52.2 %
Platelets: 189 10*3/uL (ref 140–400)
RBC: 4.85 10*6/uL (ref 3.80–5.10)
RDW: 12.2 % (ref 11.0–15.0)
Total Lymphocyte: 33.7 %
WBC: 5.2 10*3/uL (ref 3.8–10.8)

## 2023-07-04 LAB — COMPLETE METABOLIC PANEL WITH GFR
AG Ratio: 1.9 (calc) (ref 1.0–2.5)
ALT: 37 U/L — ABNORMAL HIGH (ref 6–29)
AST: 34 U/L (ref 10–35)
Albumin: 4.7 g/dL (ref 3.6–5.1)
Alkaline phosphatase (APISO): 77 U/L (ref 37–153)
BUN: 18 mg/dL (ref 7–25)
CO2: 28 mmol/L (ref 20–32)
Calcium: 9.5 mg/dL (ref 8.6–10.4)
Chloride: 102 mmol/L (ref 98–110)
Creat: 0.63 mg/dL (ref 0.50–1.05)
Globulin: 2.5 g/dL (ref 1.9–3.7)
Glucose, Bld: 87 mg/dL (ref 65–99)
Potassium: 4.7 mmol/L (ref 3.5–5.3)
Sodium: 139 mmol/L (ref 135–146)
Total Bilirubin: 0.5 mg/dL (ref 0.2–1.2)
Total Protein: 7.2 g/dL (ref 6.1–8.1)
eGFR: 97 mL/min/{1.73_m2} (ref >=60)

## 2023-07-04 LAB — LIPID PANEL
Cholesterol: 172 mg/dL (ref <200)
HDL: 71 mg/dL (ref >=50)
LDL Cholesterol (Calc): 82 mg/dL
Non-HDL Cholesterol (Calc): 101 mg/dL (ref <130)
Total CHOL/HDL Ratio: 2.4 (calc) (ref <5.0)
Triglycerides: 94 mg/dL (ref <150)

## 2023-07-29 ENCOUNTER — Ambulatory Visit (AMBULATORY_SURGERY_CENTER): Payer: Medicare HMO

## 2023-07-29 VITALS — Ht 62.0 in | Wt 163.0 lb

## 2023-07-29 DIAGNOSIS — Z860100 Personal history of colon polyps, unspecified: Secondary | ICD-10-CM

## 2023-07-29 MED ORDER — NA SULFATE-K SULFATE-MG SULF 17.5-3.13-1.6 GM/177ML PO SOLN: 1.0000 | Freq: Once | ORAL | 0 refills | Status: AC

## 2023-07-29 NOTE — Progress Notes (Signed)

## 2023-08-01 ENCOUNTER — Other Ambulatory Visit: Payer: Self-pay | Admitting: Family Medicine

## 2023-08-19 ENCOUNTER — Other Ambulatory Visit: Payer: Self-pay | Admitting: Family Medicine

## 2023-08-27 ENCOUNTER — Other Ambulatory Visit: Payer: Self-pay | Admitting: Family Medicine

## 2023-08-28 ENCOUNTER — Encounter: Payer: Self-pay | Admitting: Internal Medicine

## 2023-09-02 ENCOUNTER — Ambulatory Visit (AMBULATORY_SURGERY_CENTER): Payer: Medicare HMO | Admitting: Internal Medicine

## 2023-09-02 ENCOUNTER — Encounter: Payer: Self-pay | Admitting: Internal Medicine

## 2023-09-02 VITALS — BP 118/65 | HR 77 | Temp 97.7°F | Resp 16 | Ht 62.0 in | Wt 163.0 lb

## 2023-09-02 DIAGNOSIS — Z1211 Encounter for screening for malignant neoplasm of colon: Secondary | ICD-10-CM

## 2023-09-02 DIAGNOSIS — K5730 Diverticulosis of large intestine without perforation or abscess without bleeding: Secondary | ICD-10-CM

## 2023-09-02 DIAGNOSIS — Z860101 Personal history of adenomatous and serrated colon polyps: Secondary | ICD-10-CM | POA: Diagnosis not present

## 2023-09-02 DIAGNOSIS — Z860100 Personal history of colon polyps, unspecified: Secondary | ICD-10-CM

## 2023-09-02 MED ORDER — SODIUM CHLORIDE 0.9 % IV SOLN: 500.0000 mL | Freq: Once | INTRAVENOUS | Status: AC

## 2023-09-02 NOTE — Patient Instructions (Signed)
-  Handout on diverticulosis provided  -repeat colonoscopy in 10 years for surveillance recommended.  -Continue present medications    YOU HAD AN ENDOSCOPIC PROCEDURE TODAY AT THE [facility]:   Refer to the procedure report that was given to you for any specific questions about what was found during the examination.  If the procedure report does not answer your questions, please call your gastroenterologist to clarify.  If you requested that your care partner not be given the details of your procedure findings, then the procedure report has been included in a sealed envelope for you to review at your convenience later.  YOU SHOULD EXPECT: Some feelings of bloating in the abdomen. Passage of more gas than usual.  Walking can help get rid of the air that was put into your GI tract during the procedure and reduce the bloating. If you had a lower endoscopy (such as a colonoscopy or flexible sigmoidoscopy) you may notice spotting of blood in your stool or on the toilet paper. If you underwent a bowel prep for your procedure, you may not have a normal bowel movement for a few days.  Please Note:  You might notice some irritation and congestion in your nose or some drainage.  This is from the oxygen used during your procedure.  There is no need for concern and it should clear up in a day or so.  SYMPTOMS TO REPORT IMMEDIATELY:  Following lower endoscopy (colonoscopy or flexible sigmoidoscopy):  Excessive amounts of blood in the stool  Significant tenderness or worsening of abdominal pains  Swelling of the abdomen that is new, acute  Fever of 100F or higher   For urgent or emergent issues, a gastroenterologist can be reached at any hour by calling [phone]. Do not use MyChart messaging for urgent concerns.    DIET:  We do recommend a small meal at first, but then you may proceed to your regular diet.  Drink plenty of fluids but you should avoid alcoholic beverages for 24  hours.  ACTIVITY:  You should plan to take it easy for the rest of today and you should NOT DRIVE or use heavy machinery until tomorrow (because of the sedation medicines used during the test).    FOLLOW UP: Our staff will call the number listed on your records the next business day following your procedure.  We will call around 7:15- 8:00 am to check on you and address any questions or concerns that you may have regarding the information given to you following your procedure. If we do not reach you, we will leave a message.     If any biopsies were taken you will be contacted by phone or by letter within the next 1-3 weeks.  Please call us at [phone] if you have not heard about the biopsies in 3 weeks.    SIGNATURES/CONFIDENTIALITY: You and/or your care partner have signed paperwork which will be entered into your electronic medical record.  These signatures attest to the fact that that the information above on your After Visit Summary has been reviewed and is understood.  Full responsibility of the confidentiality of this discharge information lies with you and/or your care-partner.  

## 2023-09-02 NOTE — Progress Notes (Signed)
 HISTORY OF PRESENT ILLNESS:  Alexa Allen is a 68 y.o. female with a history of adenomatous colon polyps.  Now for surveillance colonoscopy  REVIEW OF SYSTEMS:  All non-GI ROS negative except for  Past Medical History:  Diagnosis Date   Arthritis    hands   Elevated liver enzymes    GERD (gastroesophageal reflux disease)    HSV (herpes simplex virus) infection    Liver cyst    Osteoporosis    SVD (spontaneous vaginal delivery)    x 2   Wears partial dentures    upper    Past Surgical History:  Procedure Laterality Date   bladder tack     COLONOSCOPY     02/2008    MULTIPLE TOOTH EXTRACTIONS     TUBAL LIGATION     VAGINAL HYSTERECTOMY     WISDOM TOOTH EXTRACTION      Social History Alexa Allen  reports that she has quit smoking. Her smoking use included cigarettes. She has never used smokeless tobacco. She reports current alcohol use of about 1.0 - 2.0 standard drink of alcohol per week. She reports that she does not use drugs.  family history includes COPD in her mother; Esophageal cancer in her father; Throat cancer in her father.  Allergies  Allergen Reactions   Iodinated Contrast Media Hives    (Iohexol) hives with IV cm- tolerated 1 hour quick prep well, Onset Date: [id number]   Oxycodone-Acetaminophen Other (See Comments)    Hallucinations and itching       PHYSICAL EXAMINATION: Vital signs: BP 128/74   Pulse 81   Temp 97.7 F (36.5 C)   Ht 5' 2 (1.575 m)   Wt 163 lb (73.9 kg)   LMP  (LMP Unknown)   SpO2 96%   BMI 29.81 kg/m  General: Well-developed, well-nourished, no acute distress HEENT: Sclerae are anicteric, conjunctiva pink. Oral mucosa intact Lungs: Clear Heart: Regular Abdomen: soft, nontender, nondistended, no obvious ascites, no peritoneal signs, normal bowel sounds. No organomegaly. Extremities: No edema Psychiatric: alert and oriented x3. Cooperative     ASSESSMENT:  History of adenomatous colon  polyps   PLAN:  Surveillance colonoscopy

## 2023-09-02 NOTE — Progress Notes (Signed)
 Report to PACU, RN, vss, BBS= Clear.

## 2023-09-02 NOTE — Op Note (Signed)
 [facility] Patient Name: Alexa Allen Procedure Date: 09/02/2023 9:47 AM MRN: [id number] Endoscopist: Norleen SAILOR. Abran , MD, [id number] Age: 68 Referring MD:  Date of Birth: May 03, 1956 Gender: Female Account #: [id number] Procedure:                Colonoscopy Indications:              High risk colon cancer surveillance: Personal                            history of non-advanced adenoma. Previous                            examinations 2009, 2019 Medicines:                Monitored Anesthesia Care Procedure:                Pre-Anesthesia Assessment:                           - Prior to the procedure, a History and Physical                            was performed, and patient medications and                            allergies were reviewed. The patient's tolerance of                            previous anesthesia was also reviewed. The risks                            and benefits of the procedure and the sedation                            options and risks were discussed with the patient.                            All questions were answered, and informed consent                            was obtained. Prior Anticoagulants: The patient has                            taken no anticoagulant or antiplatelet agents. ASA                            Grade Assessment: II - A patient with mild systemic                            disease. After reviewing the risks and benefits,                            the patient was deemed in satisfactory condition to  undergo the procedure.                           After obtaining informed consent, the colonoscope                            was passed under direct vision. Throughout the                            procedure, the patient's blood pressure, pulse, and                            oxygen saturations were monitored continuously. The                            CF [id number] #[id number] was introduced through  the anus                            and advanced to the the cecum, identified by                            appendiceal orifice and ileocecal valve. The                            ileocecal valve, appendiceal orifice, and rectum                            were photographed. The quality of the bowel                            preparation was excellent. The colonoscopy was                            performed without difficulty. The patient tolerated                            the procedure well. The bowel preparation used was                            SUPREP via split dose instruction. Scope In: 9:52:32 AM Scope Out: 10:01:41 AM Scope Withdrawal Time: 0 hours 7 minutes 22 seconds  Total Procedure Duration: 0 hours 9 minutes 9 seconds  Findings:                 Diverticula were found in the sigmoid colon.                           The exam was otherwise without abnormality on                            direct and retroflexion views. Complications:            No immediate complications. Estimated blood loss:  None. Estimated Blood Loss:     Estimated blood loss: none. Impression:               - Diverticulosis in the sigmoid colon.                           - The examination was otherwise normal on direct                            and retroflexion views.                           - No specimens collected. Recommendation:           - Repeat colonoscopy in 10 years for surveillance.                           - Patient has a contact number available for                            emergencies. The signs and symptoms of potential                            delayed complications were discussed with the                            patient. Return to normal activities tomorrow.                            Written discharge instructions were provided to the                            patient.                           - Resume previous diet.                           -  Continue present medications. Norleen SAILOR. Abran, MD 09/02/2023 10:08:10 AM This report has been signed electronically.

## 2023-09-02 NOTE — Progress Notes (Signed)
 Pt's states no medical or surgical changes since previsit or office visit.

## 2023-09-03 ENCOUNTER — Telehealth: Payer: Self-pay

## 2023-09-03 NOTE — Telephone Encounter (Signed)
  Follow up Call-     09/02/2023    8:57 AM  Call back number  Post procedure Call Back phone  # [phone]  Permission to leave phone message Yes     Patient questions:  Do you have a fever, pain , or abdominal swelling? No. Pain Score  0 *  Have you tolerated food without any problems? Yes.    Have you been able to return to your normal activities? Yes.    Do you have any questions about your discharge instructions: Diet   No. Medications  No. Follow up visit  No.  Do you have questions or concerns about your Care? No.  Actions: * If pain score is 4 or above: No action needed, pain <4.

## 2023-09-18 ENCOUNTER — Other Ambulatory Visit: Payer: Self-pay | Admitting: Family Medicine

## 2023-09-18 DIAGNOSIS — Z780 Asymptomatic menopausal state: Secondary | ICD-10-CM

## 2023-09-18 DIAGNOSIS — M810 Age-related osteoporosis without current pathological fracture: Secondary | ICD-10-CM

## 2023-09-18 NOTE — Telephone Encounter (Signed)
Copied from CRM #[id number]. Topic: Clinical - Medication Refill >> Sep 18, 2023 10:57 AM Joanette Gula wrote: Most Recent Primary Care Visit:  Provider: Lynnea Ferrier T  Department: [facility]  Visit Type: PHYSICAL  Date: 07/03/2023  Medication: alendronate (FOSAMAX) 70 MG tablet   Has the patient contacted their pharmacy? Yes (Agent: If no, request that the patient contact the pharmacy for the refill. If patient does not wish to contact the pharmacy document the reason why and proceed with request.) (Agent: If yes, when and what did the pharmacy advise?)  Is this the correct pharmacy for this prescription? Yes If no, delete pharmacy and type the correct one.  This is the patient's preferred pharmacy:    [id number] [place], [place] - [address] [place] [address] Phone: [phone] Fax: [phone]    Has the prescription been filled recently? Yes  Is the patient out of the medication? Yes  Has the patient been seen for an appointment in the last year OR does the patient have an upcoming appointment? Yes  Can we respond through MyChart? No  Agent: Please be advised that Rx refills may take up to 3 business days. We ask that you follow-up with your pharmacy.

## 2023-09-19 MED ORDER — ALENDRONATE SODIUM 70 MG PO TABS: 70.0000 mg | ORAL_TABLET | ORAL | 0 refills | Status: DC

## 2023-12-13 ENCOUNTER — Other Ambulatory Visit: Payer: Self-pay | Admitting: Family Medicine

## 2023-12-13 DIAGNOSIS — Z780 Asymptomatic menopausal state: Secondary | ICD-10-CM

## 2023-12-13 DIAGNOSIS — M810 Age-related osteoporosis without current pathological fracture: Secondary | ICD-10-CM

## 2023-12-15 NOTE — Telephone Encounter (Signed)
 Requested Prescriptions  Pending Prescriptions Disp Refills   alendronate  (FOSAMAX ) 70 MG tablet [Pharmacy Med Name: ALENDRONATE  SODIUM 70 MG TAB] 12 tablet 0    Sig: TAKE 1 TABLET BY MOUTH ONCE A WEEK. TAKE WITH A FULL GLASS OF WATER ON AN EMPTY STOMACH     Endocrinology:  Bisphosphonates Failed - 12/15/2023 11:50 AM      Failed - Vitamin D in normal range and within 360 days    No results found for: "GN5621HY8", "MV7846NG2", "VD125OH2TOT", "25OHVITD3", "25OHVITD2", "25OHVITD1", "VD25OH"       Failed - Mg Level in normal range and within 360 days    No results found for: "MG"       Failed - Phosphate in normal range and within 360 days    No results found for: "PHOS"       Passed - Ca in normal range and within 360 days    Calcium   Date Value Ref Range Status  07/03/2023 9.5 8.6 - 10.4 mg/dL Final         Passed - Cr in normal range and within 360 days    Creat  Date Value Ref Range Status  07/03/2023 0.63 0.50 - 1.05 mg/dL Final         Passed - eGFR is 30 or above and within 360 days    GFR, Est African American  Date Value Ref Range Status  01/10/2020 85 > OR = 60 mL/min/1.9m2 Final   GFR, Est Non African American  Date Value Ref Range Status  01/10/2020 73 > OR = 60 mL/min/1.34m2 Final   eGFR  Date Value Ref Range Status  07/03/2023 97 > OR = 60 mL/min/1.40m2 Final         Passed - Valid encounter within last 12 months    Recent Outpatient Visits           5 months ago Pure hypercholesterolemia   [place] [facility] Austine Lefort, MD   7 months ago Acute bacterial sinusitis   [facility] Jenelle Mis, FNP   9 months ago Right flank pain   [place] [facility] Cheril Cork, Cisco Crest, MD   1 year ago Renal cyst   [facility] Austine Lefort, MD   1 year ago Chronic sinusitis, unspecified location   [facility] Pickard, Cisco Crest,  MD              Passed - Bone Mineral Density or Dexa Scan completed in the last 2 years

## 2024-01-06 ENCOUNTER — Ambulatory Visit: Admitting: Family Medicine

## 2024-01-19 ENCOUNTER — Other Ambulatory Visit: Payer: Self-pay

## 2024-01-19 NOTE — Telephone Encounter (Signed)
 Prescription Request  01/19/2024  LOV: 07/03/23  What is the name of the medication or equipment? acyclovir  (ZOVIRAX ) 400 MG tablet [[id number]]   Have you contacted your pharmacy to request a refill? Yes   Which pharmacy would you like this sent to?  [id number] [place], [place] - [address] [place] [address] Phone: [phone] Fax: [phone]    Patient notified that their request is being sent to the clinical staff for review and that they should receive a response within 2 business days.   Please advise at [facility] [phone]

## 2024-01-20 MED ORDER — ACYCLOVIR 400 MG PO TABS
400.0000 mg | ORAL_TABLET | Freq: Every day | ORAL | 1 refills | Status: DC
Start: 2024-01-20 — End: 2024-05-18

## 2024-01-20 NOTE — Telephone Encounter (Signed)
 Requested Prescriptions  Pending Prescriptions Disp Refills   acyclovir  (ZOVIRAX ) 400 MG tablet 60 tablet 1    Sig: Take 1 tablet (400 mg total) by mouth daily.     Antimicrobials:  Antiviral Agents - Anti-Herpetic Passed - 01/20/2024 11:13 AM      Passed - Valid encounter within last 12 months    Recent Outpatient Visits           6 months ago Pure hypercholesterolemia   [facility] Austine Lefort, MD   8 months ago Acute bacterial sinusitis   [facility] Jenelle Mis, FNP   10 months ago Right flank pain   [facility] [facility] Cheril Cork, Cisco Crest, MD   1 year ago Renal cyst   [facility] Austine Lefort, MD   1 year ago Chronic sinusitis, unspecified location   [facility] Pickard, Cisco Crest, MD

## 2024-02-06 ENCOUNTER — Other Ambulatory Visit: Payer: Self-pay

## 2024-02-06 ENCOUNTER — Telehealth: Payer: Self-pay | Admitting: Family Medicine

## 2024-02-06 MED ORDER — OMEPRAZOLE 20 MG PO CPDR
20.0000 mg | DELAYED_RELEASE_CAPSULE | Freq: Every day | ORAL | 1 refills | Status: AC
Start: 2024-02-06 — End: ?

## 2024-02-06 NOTE — Telephone Encounter (Signed)
 Prescription Request  02/06/2024  LOV: 07/03/2023  What is the name of the medication or equipment? omeprazole  (PRILOSEC) 20 MG capsule   Have you contacted your pharmacy to request a refill? Yes   Which pharmacy would you like this sent to?  [id number] [place], [place] - [address] [place] [address] Phone: [phone] Fax: [phone]    Patient notified that their request is being sent to the clinical staff for review and that they should receive a response within 2 business days.   Please advise at [facility] [phone]

## 2024-02-11 ENCOUNTER — Telehealth: Payer: Self-pay

## 2024-02-11 NOTE — Telephone Encounter (Signed)
 Copied from CRM [id number]. Topic: Clinical - Request for Lab/Test Order >> Feb 10, 2024  3:13 PM Tobias L wrote: Reason for CRM: Pt requesting appointment for mammogram, pt requesting appt w/ mammogram bus.   Please give pt call back, [phone]

## 2024-02-12 ENCOUNTER — Other Ambulatory Visit: Payer: Self-pay

## 2024-02-12 DIAGNOSIS — Z1231 Encounter for screening mammogram for malignant neoplasm of breast: Secondary | ICD-10-CM

## 2024-02-17 ENCOUNTER — Other Ambulatory Visit: Payer: Self-pay | Admitting: Family Medicine

## 2024-02-19 ENCOUNTER — Ambulatory Visit: Payer: Medicare HMO

## 2024-03-01 DIAGNOSIS — H5213 Myopia, bilateral: Secondary | ICD-10-CM | POA: Diagnosis not present

## 2024-03-08 ENCOUNTER — Other Ambulatory Visit: Payer: Self-pay | Admitting: Family Medicine

## 2024-03-08 DIAGNOSIS — M810 Age-related osteoporosis without current pathological fracture: Secondary | ICD-10-CM

## 2024-03-08 DIAGNOSIS — Z780 Asymptomatic menopausal state: Secondary | ICD-10-CM

## 2024-03-09 NOTE — Telephone Encounter (Signed)
 Requested medications are due for refill today.  yes  Requested medications are on the active medications list.  yes  Last refill. 12/15/2023 #12 0 rf  Future visit scheduled.   no  Notes to clinic.  Missing labs.    Requested Prescriptions  Pending Prescriptions Disp Refills   alendronate  (FOSAMAX ) 70 MG tablet [Pharmacy Med Name: ALENDRONATE  SODIUM 70 MG TAB] 12 tablet 0    Sig: TAKE 1 TABLET BY MOUTH ONCE A WEEK. TAKE WITH A FULL GLASS OF WATER ON AN EMPTY STOMACH     Endocrinology:  Bisphosphonates Failed - 03/09/2024  4:11 PM      Failed - Vitamin D in normal range and within 360 days    No results found for: [id number], CI6874NY7, [id number], 25OHVITD3, 25OHVITD2, 25OHVITD1, VD25OH       Failed - Mg Level in normal range and within 360 days    No results found for: MG       Failed - Phosphate in normal range and within 360 days    No results found for: PHOS       Passed - Ca in normal range and within 360 days    Calcium   Date Value Ref Range Status  07/03/2023 9.5 8.6 - 10.4 mg/dL Final         Passed - Cr in normal range and within 360 days    Creat  Date Value Ref Range Status  07/03/2023 0.63 0.50 - 1.05 mg/dL Final         Passed - eGFR is 30 or above and within 360 days    GFR, Est African American  Date Value Ref Range Status  01/10/2020 85 > OR = 60 mL/min/1.34m2 Final   GFR, Est Non African American  Date Value Ref Range Status  01/10/2020 73 > OR = 60 mL/min/1.59m2 Final   eGFR  Date Value Ref Range Status  07/03/2023 97 > OR = 60 mL/min/1.45m2 Final         Passed - Valid encounter within last 12 months    Recent Outpatient Visits           8 months ago Pure hypercholesterolemia   [place] [facility] Duanne Butler DASEN, MD   10 months ago Acute bacterial sinusitis   [facility] Kayla Jeoffrey RAMAN, FNP   11 months ago Right flank pain   [facility]  Duanne, Butler DASEN, MD   1 year ago Renal cyst   [facility] Duanne Butler DASEN, MD   1 year ago Chronic sinusitis, unspecified location   [facility] Pickard, Butler DASEN, MD              Passed - Bone Mineral Density or Dexa Scan completed in the last 2 years

## 2024-04-13 ENCOUNTER — Encounter

## 2024-04-28 ENCOUNTER — Telehealth: Payer: Self-pay

## 2024-04-28 NOTE — Telephone Encounter (Signed)
 MyChart msg sent.

## 2024-04-28 NOTE — Telephone Encounter (Signed)
 Copied from CRM [id number]. Topic: Appointments - Appointment Scheduling >> Apr 28, 2024 12:00 PM Pinkey ORN wrote: Patient/patient representative is calling to schedule an appointment. Refer to attachments for appointment information. >> Apr 28, 2024 12:02 PM Pinkey ORN wrote: Patient states she's needing to schedule to have her mammogram completed, states she missed her last appointment due to having her days mixed up. Please follow up with patient in regards to scheduling.

## 2024-04-30 ENCOUNTER — Ambulatory Visit
Admission: RE | Admit: 2024-04-30 | Discharge: 2024-04-30 | Disposition: A | Discharge status: Home / Self Care | Source: Ambulatory Visit | Attending: Family Medicine | Admitting: Family Medicine

## 2024-04-30 DIAGNOSIS — Z1231 Encounter for screening mammogram for malignant neoplasm of breast: Secondary | ICD-10-CM | POA: Diagnosis not present

## 2024-05-13 ENCOUNTER — Ambulatory Visit (INDEPENDENT_AMBULATORY_CARE_PROVIDER_SITE_OTHER): Admitting: *Deleted

## 2024-05-13 VITALS — Ht 62.0 in | Wt 163.0 lb

## 2024-05-13 DIAGNOSIS — Z0000 Encounter for general adult medical examination without abnormal findings: Secondary | ICD-10-CM | POA: Diagnosis not present

## 2024-05-13 DIAGNOSIS — Z780 Asymptomatic menopausal state: Secondary | ICD-10-CM | POA: Diagnosis not present

## 2024-05-13 NOTE — Patient Instructions (Signed)
 Alexa Allen , Thank you for taking time to come for your Medicare Wellness Visit. I appreciate your ongoing commitment to your health goals. Please review the following plan we discussed and let me know if I can assist you in the future.   Screening recommendations/referrals: Colonoscopy: up to date Mammogram: up to date Bone Density: Education provided Recommended yearly ophthalmology/optometry visit for glaucoma screening and checkup Recommended yearly dental visit for hygiene and checkup  Vaccinations: Influenza vaccine: Education provided Pneumococcal vaccine: up to date Tdap vaccine: Education provided Shingles vaccine: Education provided       Preventive Care 65 Years and Older, Female Preventive care refers to lifestyle choices and visits with your health care provider that can promote health and wellness. What does preventive care include? A yearly physical exam. This is also called an annual well check. Dental exams once or twice a year. Routine eye exams. Ask your health care provider how often you should have your eyes checked. Personal lifestyle choices, including: Daily care of your teeth and gums. Regular physical activity. Eating a healthy diet. Avoiding tobacco and drug use. Limiting alcohol use. Practicing safe sex. Taking low-dose aspirin every day. Taking vitamin and mineral supplements as recommended by your health care provider. What happens during an annual well check? The services and screenings done by your health care provider during your annual well check will depend on your age, overall health, lifestyle risk factors, and family history of disease. Counseling  Your health care provider may ask you questions about your: Alcohol use. Tobacco use. Drug use. Emotional well-being. Home and relationship well-being. Sexual activity. Eating habits. History of falls. Memory and ability to understand (cognition). Work and work  Astronomer. Reproductive health. Screening  You may have the following tests or measurements: Height, weight, and BMI. Blood pressure. Lipid and cholesterol levels. These may be checked every 5 years, or more frequently if you are over 35 years old. Skin check. Lung cancer screening. You may have this screening every year starting at age 27 if you have a 30-pack-year history of smoking and currently smoke or have quit within the past 15 years. Fecal occult blood test (FOBT) of the stool. You may have this test every year starting at age 32. Flexible sigmoidoscopy or colonoscopy. You may have a sigmoidoscopy every 5 years or a colonoscopy every 10 years starting at age 76. Hepatitis C blood test. Hepatitis B blood test. Sexually transmitted disease (STD) testing. Diabetes screening. This is done by checking your blood sugar (glucose) after you have not eaten for a while (fasting). You may have this done every 1-3 years. Bone density scan. This is done to screen for osteoporosis. You may have this done starting at age 74. Mammogram. This may be done every 1-2 years. Talk to your health care provider about how often you should have regular mammograms. Talk with your health care provider about your test results, treatment options, and if necessary, the need for more tests. Vaccines  Your health care provider may recommend certain vaccines, such as: Influenza vaccine. This is recommended every year. Tetanus, diphtheria, and acellular pertussis (Tdap, Td) vaccine. You may need a Td booster every 10 years. Zoster vaccine. You may need this after age 54. Pneumococcal 13-valent conjugate (PCV13) vaccine. One dose is recommended after age 27. Pneumococcal polysaccharide (PPSV23) vaccine. One dose is recommended after age 15. Talk to your health care provider about which screenings and vaccines you need and how often you need them. This information is not  intended to replace advice given to you by  your health care provider. Make sure you discuss any questions you have with your health care provider. Document Released: 09/01/2015 Document Revised: 04/24/2016 Document Reviewed: 06/06/2015 Elsevier Interactive Patient Education  2017 [employer].  Fall Prevention in the Home Falls can cause injuries. They can happen to people of all ages. There are many things you can do to make your home safe and to help prevent falls. What can I do on the outside of my home? Regularly fix the edges of walkways and driveways and fix any cracks. Remove anything that might make you trip as you walk through a door, such as a raised step or threshold. Trim any bushes or trees on the path to your home. Use bright outdoor lighting. Clear any walking paths of anything that might make someone trip, such as rocks or tools. Regularly check to see if handrails are loose or broken. Make sure that both sides of any steps have handrails. Any raised decks and porches should have guardrails on the edges. Have any leaves, snow, or ice cleared regularly. Use sand or salt on walking paths during winter. Clean up any spills in your garage right away. This includes oil or grease spills. What can I do in the bathroom? Use night lights. Install grab bars by the toilet and in the tub and shower. Do not use towel bars as grab bars. Use non-skid mats or decals in the tub or shower. If you need to sit down in the shower, use a plastic, non-slip stool. Keep the floor dry. Clean up any water that spills on the floor as soon as it happens. Remove soap buildup in the tub or shower regularly. Attach bath mats securely with double-sided non-slip rug tape. Do not have throw rugs and other things on the floor that can make you trip. What can I do in the bedroom? Use night lights. Make sure that you have a light by your bed that is easy to reach. Do not use any sheets or blankets that are too big for your bed. They should not hang  down onto the floor. Have a firm chair that has side arms. You can use this for support while you get dressed. Do not have throw rugs and other things on the floor that can make you trip. What can I do in the kitchen? Clean up any spills right away. Avoid walking on wet floors. Keep items that you use a lot in easy-to-reach places. If you need to reach something above you, use a strong step stool that has a grab bar. Keep electrical cords out of the way. Do not use floor polish or wax that makes floors slippery. If you must use wax, use non-skid floor wax. Do not have throw rugs and other things on the floor that can make you trip. What can I do with my stairs? Do not leave any items on the stairs. Make sure that there are handrails on both sides of the stairs and use them. Fix handrails that are broken or loose. Make sure that handrails are as long as the stairways. Check any carpeting to make sure that it is firmly attached to the stairs. Fix any carpet that is loose or worn. Avoid having throw rugs at the top or bottom of the stairs. If you do have throw rugs, attach them to the floor with carpet tape. Make sure that you have a light switch at the top of the stairs  and the bottom of the stairs. If you do not have them, ask someone to add them for you. What else can I do to help prevent falls? Wear shoes that: Do not have high heels. Have rubber bottoms. Are comfortable and fit you well. Are closed at the toe. Do not wear sandals. If you use a stepladder: Make sure that it is fully opened. Do not climb a closed stepladder. Make sure that both sides of the stepladder are locked into place. Ask someone to hold it for you, if possible. Clearly mark and make sure that you can see: Any grab bars or handrails. First and last steps. Where the edge of each step is. Use tools that help you move around (mobility aids) if they are needed. These  include: Canes. Walkers. Scooters. Crutches. Turn on the lights when you go into a dark area. Replace any light bulbs as soon as they burn out. Set up your furniture so you have a clear path. Avoid moving your furniture around. If any of your floors are uneven, fix them. If there are any pets around you, be aware of where they are. Review your medicines with your doctor. Some medicines can make you feel dizzy. This can increase your chance of falling. Ask your doctor what other things that you can do to help prevent falls. This information is not intended to replace advice given to you by your health care provider. Make sure you discuss any questions you have with your health care provider. Document Released: 06/01/2009 Document Revised: 01/11/2016 Document Reviewed: 09/09/2014 Elsevier Interactive Patient Education  2017 [employer].

## 2024-05-13 NOTE — Progress Notes (Signed)
 Subjective:   Alexa Allen is a 68 y.o. female who presents for Medicare Annual (Subsequent) preventive examination.  Visit Complete: Virtual I connected with  [facility] on 05/13/24 by a audio enabled telemedicine application and verified that I am speaking with the correct person using two identifiers.  Patient Location: Home  Provider Location: Home Office  I discussed the limitations of evaluation and management by telemedicine. The patient expressed understanding and agreed to proceed.  Vital Signs: Because this visit was a virtual/telehealth visit, some criteria may be missing or patient reported. Any vitals not documented were not able to be obtained and vitals that have been documented are patient reported.  Cardiac Risk Factors include: advanced age (>51men, >47 women);obesity (BMI >30kg/m2)     Objective:    Today's Vitals   05/13/24 0905  Weight: 163 lb (73.9 kg)  Height: 5' 2 (1.575 m)   Body mass index is 29.81 kg/m.     05/13/2024    8:59 AM 02/13/2023   12:24 PM 02/07/2022    9:27 AM  Advanced Directives  Does Patient Have a Medical Advance Directive? Yes No No  Type of Electronics engineer of Healthcare Power of Attorney in Chart? No - copy requested    Would patient like information on creating a medical advance directive?  Yes (MAU/Ambulatory/Procedural Areas - Information given) No - Patient declined    Current Medications (verified) Outpatient Encounter Medications as of 05/13/2024  Medication Sig   acyclovir  (ZOVIRAX ) 400 MG tablet Take 1 tablet (400 mg total) by mouth daily.   alendronate  (FOSAMAX ) 70 MG tablet TAKE 1 TABLET BY MOUTH ONCE A WEEK. TAKE WITH A FULL GLASS OF WATER ON AN EMPTY STOMACH   aspirin EC 81 MG tablet Take 81 mg by mouth every other day. Swallow whole.   atorvastatin  (LIPITOR) 20 MG tablet TAKE 1 TABLET BY MOUTH EVERY DAY   Cyanocobalamin (VITAMIN B 12 PO) Take 1 tablet by mouth  daily.   Multiple Vitamin (MULTIVITAMIN) tablet Take 1 tablet by mouth daily.   omeprazole  (PRILOSEC) 20 MG capsule Take 1 capsule (20 mg total) by mouth daily.   TURMERIC-GINGER PO Take by mouth daily at 6 (six) AM.   Facility-Administered Encounter Medications as of 05/13/2024  Medication   0.9 %  sodium chloride  infusion    Allergies (verified) Iodinated contrast media and Oxycodone-acetaminophen   History: Past Medical History:  Diagnosis Date   Arthritis    hands   Elevated liver enzymes    GERD (gastroesophageal reflux disease)    HSV (herpes simplex virus) infection    Liver cyst    Osteoporosis    SVD (spontaneous vaginal delivery)    x 2   Wears partial dentures    upper   Past Surgical History:  Procedure Laterality Date   bladder tack     COLONOSCOPY     02/2008    MULTIPLE TOOTH EXTRACTIONS     TUBAL LIGATION     VAGINAL HYSTERECTOMY     WISDOM TOOTH EXTRACTION     Family History  Problem Relation Age of Onset   COPD Mother    Esophageal cancer Father    Throat cancer Father    Colon cancer Neg Hx    Rectal cancer Neg Hx    Stomach cancer Neg Hx    Breast cancer Neg Hx    Colon polyps Neg Hx    Social History   Socioeconomic History  Marital status: Married    Spouse name: Not on file   Number of children: 2   Years of education: Not on file   Highest education level: GED or equivalent  Occupational History   Occupation: Control and instrumentation engineer  Tobacco Use   Smoking status: Former    Current packs/day: 0.25    Types: Cigarettes   Smokeless tobacco: Never   Tobacco comments:    Quit 2011  Vaping Use   Vaping status: Never Used  Substance and Sexual Activity   Alcohol use: Yes    Alcohol/week: 1.0 - 2.0 standard drink of alcohol    Types: 1 - 2 Cans of beer per week   Drug use: No   Sexual activity: Yes    Birth control/protection: Post-menopausal    Comment: Hysterectomy  Other Topics Concern   Not on file  Social History Narrative    Not on file   Social Drivers of Health   Financial Resource Strain: Low Risk  (05/13/2024)   Overall Financial Resource Strain (CARDIA)    Difficulty of Paying Living Expenses: Not hard at all  Food Insecurity: No Food Insecurity (05/13/2024)   Hunger Vital Sign    Worried About Running Out of Food in the Last Year: Never true    Ran Out of Food in the Last Year: Never true  Transportation Needs: No Transportation Needs (05/13/2024)   PRAPARE - Administrator, Civil Service (Medical): No    Lack of Transportation (Non-Medical): No  Physical Activity: Inactive (05/13/2024)   Exercise Vital Sign    Days of Exercise per Week: 0 days    Minutes of Exercise per Session: 0 min  Stress: No Stress Concern Present (05/13/2024)   [employer] of Occupational Health - Occupational Stress Questionnaire    Feeling of Stress: Not at all  Social Connections: Moderately Isolated (05/13/2024)   Social Connection and Isolation Panel    Frequency of Communication with Friends and Family: Three times a week    Frequency of Social Gatherings with Friends and Family: Twice a week    Attends Religious Services: Never    Database administrator or Organizations: No    Attends Engineer, structural: Never    Marital Status: Married    Tobacco Counseling Counseling given: Not Answered Tobacco comments: Quit 2011   Clinical Intake:  Pre-visit preparation completed: Yes  Pain : No/denies pain     Diabetes: No  How often do you need to have someone help you when you read instructions, pamphlets, or other written materials from your doctor or pharmacy?: 1 - Never  Interpreter Needed?: No  Information entered by :: Mliss Graff LPN   Activities of Daily Living    05/13/2024    9:12 AM  In your present state of health, do you have any difficulty performing the following activities:  Hearing? 0  Vision? 0  Difficulty concentrating or making decisions? 0  Walking or  climbing stairs? 0  Dressing or bathing? 0  Doing errands, shopping? 0  Preparing Food and eating ? N  Using the Toilet? N  In the past six months, have you accidently leaked urine? N  Do you have problems with loss of bowel control? N  Managing your Medications? N  Managing your Finances? N  Housekeeping or managing your Housekeeping? N    Patient Care Team: Duanne Butler DASEN, MD as PCP - General (Family Medicine) Cleotilde Sewer, OD (Optometry) Shona Rush, MD (Dermatology)  Indicate  any recent Medical Services you may have received from other than [facility] providers in the past year (date may be approximate).     Assessment:   This is a routine wellness examination for Marcelene.  Hearing/Vision screen Hearing Screening - Comments:: No trouble hearing Vision Screening - Comments:: Up to date [facility]   Goals Addressed             This Visit's Progress    Exercise 3x per week (30 min per time)   On track    Continue to exercise and stay healthy. Travel more.     Patient Stated       Continue Current lifestyle       Depression Screen    05/13/2024    9:04 AM 07/03/2023   11:58 AM 02/13/2023   12:23 PM 09/03/2022    2:54 PM 05/03/2022    2:06 PM 02/07/2022    9:22 AM 07/06/2020    9:24 AM  PHQ 2/9 Scores  PHQ - 2 Score 0 0 0 0 0 0 0  PHQ- 9 Score 2      0    Fall Risk    05/13/2024    8:58 AM 07/03/2023   11:58 AM 02/13/2023   12:23 PM 02/11/2023   10:29 AM 09/03/2022    2:54 PM  Fall Risk   Falls in the past year? 0 0 0 0 0  Number falls in past yr: 0 0 0 0 0  Injury with Fall? 0 0 0 0 0  Risk for fall due to :   No Fall Risks  No Fall Risks  Follow up Falls evaluation completed;Education provided;Falls prevention discussed  Falls prevention discussed;Education provided;Falls evaluation completed  Falls prevention discussed      Data saved with a previous flowsheet row definition    MEDICARE RISK AT HOME: Medicare Risk at Home Any stairs in or around the  home?: No If so, are there any without handrails?: No Home free of loose throw rugs in walkways, pet beds, electrical cords, etc?: Yes Adequate lighting in your home to reduce risk of falls?: Yes Life alert?: No Use of a cane, walker or w/c?: No Grab bars in the bathroom?: Yes Shower chair or bench in shower?: Yes Elevated toilet seat or a handicapped toilet?: No  TIMED UP AND GO:  Was the test performed?  No    Cognitive Function:        05/13/2024    9:01 AM 02/13/2023   12:24 PM 02/07/2022    9:32 AM  6CIT Screen  What Year? 0 points 0 points 0 points  What month? 0 points 0 points 0 points  What time? 0 points 0 points 0 points  Count back from 20 0 points 0 points 0 points  Months in reverse 0 points 0 points 0 points  Repeat phrase 0 points 0 points 0 points  Total Score 0 points 0 points 0 points    Immunizations Immunization History  Administered Date(s) Administered   Fluad Quad(high Dose 65+) 05/30/2021   Fluad Trivalent(High Dose 65+) 07/03/2023   Influenza,inj,Quad PF,6+ Mos 05/18/2014, 05/15/2016, 07/06/2020   Influenza-Unspecified 05/22/2018, 06/22/2019   PFIZER(Purple Top)SARS-COV-2 Vaccination 04/19/2020, 05/10/2020   PNEUMOCOCCAL CONJUGATE-20 03/20/2022   Zoster, Live 05/16/2016    TDAP status: Due, Education has been provided regarding the importance of this vaccine. Advised may receive this vaccine at local pharmacy or Health Dept. Aware to provide a copy of the vaccination record if obtained from local  pharmacy or Health Dept. Verbalized acceptance and understanding.  Flu Vaccine status: Due, Education has been provided regarding the importance of this vaccine. Advised may receive this vaccine at local pharmacy or Health Dept. Aware to provide a copy of the vaccination record if obtained from local pharmacy or Health Dept. Verbalized acceptance and understanding.  Pneumococcal vaccine status: Up to date  Covid-19 vaccine status: Declined, Education  has been provided regarding the importance of this vaccine but patient still declined. Advised may receive this vaccine at local pharmacy or Health Dept.or vaccine clinic. Aware to provide a copy of the vaccination record if obtained from local pharmacy or Health Dept. Verbalized acceptance and understanding.  Qualifies for Shingles Vaccine? Yes   Zostavax completed No   Shingrix Completed?: No.    Education has been provided regarding the importance of this vaccine. Patient has been advised to call insurance company to determine out of pocket expense if they have not yet received this vaccine. Advised may also receive vaccine at local pharmacy or Health Dept. Verbalized acceptance and understanding.  Screening Tests Health Maintenance  Topic Date Due   Zoster Vaccines- Shingrix (1 of 2) [birth year]/24/1974   Influenza Vaccine  03/19/2024   DTaP/Tdap/Td (1 - Tdap) 07/02/2024 (Originally 12/24/1974)   Medicare Annual Wellness (AWV)  05/13/2025   Mammogram  04/30/2026   Colonoscopy  09/01/2033   Pneumococcal Vaccine: 50+ Years  Completed   DEXA SCAN  Completed   Hepatitis C Screening  Completed   HPV VACCINES  Aged Out   Meningococcal B Vaccine  Aged Out   COVID-19 Vaccine  Discontinued    Health Maintenance  Health Maintenance Due  Topic Date Due   Zoster Vaccines- Shingrix (1 of 2) 12/24/1974   Influenza Vaccine  03/19/2024    Colorectal cancer screening: Type of screening: Colonoscopy. Completed 2025. Repeat every 10 years  Mammogram status: Completed  . Repeat every year  Bone Density status: Ordered  . Pt provided with contact info and advised to call to schedule appt.  Lung Cancer Screening: (Low Dose CT Chest recommended if Age 65-80 years, 20 pack-year currently smoking OR have quit w/in 15years.)  .   Lung Cancer Screening Referral:   Education provided will speak with MD at upcoming visit  Additional Screening:  Hepatitis C Screening: does not qualify; Completed 2016  Vision  Screening: Recommended annual ophthalmology exams for early detection of glaucoma and other disorders of the eye. Is the patient up to date with their annual eye exam?  Yes  Who is the provider or what is the name of the office in which the patient attends annual eye exams? [facility] If pt is not established with a provider, would they like to be referred to a provider to establish care? No .   Dental Screening: Recommended annual dental exams for proper oral hygiene    Community Resource Referral / Chronic Care Management: CRR required this visit?  No   CCM required this visit?  No     Plan:     I have personally reviewed and noted the following in the patient's chart:   Medical and social history Use of alcohol, tobacco or illicit drugs  Current medications and supplements including opioid prescriptions. Patient is not currently taking opioid prescriptions. Functional ability and status Nutritional status Physical activity Advanced directives List of other physicians Hospitalizations, surgeries, and ER visits in previous 12 months Vitals Screenings to include cognitive, depression, and falls Referrals and appointments  In addition, I have  reviewed and discussed with patient certain preventive protocols, quality metrics, and best practice recommendations. A written personalized care plan for preventive services as well as general preventive health recommendations were provided to patient.     Mliss Graff, LPN   [id number]   After Visit Summary: (MyChart) Due to this being a telephonic visit, the after visit summary with patients personalized plan was offered to patient via MyChart   Nurse Notes:

## 2024-05-16 ENCOUNTER — Other Ambulatory Visit: Payer: Self-pay | Admitting: Family Medicine

## 2024-05-17 ENCOUNTER — Other Ambulatory Visit: Payer: Self-pay | Admitting: Family Medicine

## 2024-05-18 NOTE — Telephone Encounter (Signed)
 Requested Prescriptions  Pending Prescriptions Disp Refills   acyclovir  (ZOVIRAX ) 400 MG tablet [Pharmacy Med Name: ACYCLOVIR  400 MG TABLET] 60 tablet 1    Sig: TAKE 1 TABLET BY MOUTH EVERY DAY     Antimicrobials:  Antiviral Agents - Anti-Herpetic Passed - 05/18/2024  1:34 PM      Passed - Valid encounter within last 12 months    Recent Outpatient Visits           10 months ago Pure hypercholesterolemia   [place] [facility] Pickard, Butler DASEN, MD   1 year ago Acute bacterial sinusitis   [place] [facility] Kayla Jeoffrey RAMAN, FNP   1 year ago Right flank pain   [place] [facility] Duanne, Butler DASEN, MD   1 year ago Renal cyst   [place] [facility] Duanne Butler DASEN, MD   2 years ago Chronic sinusitis, unspecified location   [place] [facility] Pickard, Butler DASEN, MD

## 2024-05-18 NOTE — Telephone Encounter (Signed)
 Requested Prescriptions  Refused Prescriptions Disp Refills   omeprazole  (PRILOSEC) 20 MG capsule [Pharmacy Med Name: OMEPRAZOLE  DR 20 MG CAPSULE] 90 capsule 1    Sig: TAKE 1 CAPSULE BY MOUTH EVERY DAY     Gastroenterology: Proton Pump Inhibitors Passed - 05/18/2024  3:38 PM      Passed - Valid encounter within last 12 months    Recent Outpatient Visits           10 months ago Pure hypercholesterolemia   [place] [facility] Pickard, Butler DASEN, MD   1 year ago Acute bacterial sinusitis   [place] [facility] Kayla Jeoffrey RAMAN, FNP   1 year ago Right flank pain   [place] [facility] Duanne, Butler DASEN, MD   1 year ago Renal cyst   [place] [facility] Duanne Butler DASEN, MD   2 years ago Chronic sinusitis, unspecified location   [facility] Pickard, Butler DASEN, MD

## 2024-05-30 ENCOUNTER — Other Ambulatory Visit: Payer: Self-pay | Admitting: Family Medicine

## 2024-05-30 DIAGNOSIS — Z780 Asymptomatic menopausal state: Secondary | ICD-10-CM

## 2024-05-30 DIAGNOSIS — M810 Age-related osteoporosis without current pathological fracture: Secondary | ICD-10-CM

## 2024-07-06 ENCOUNTER — Ambulatory Visit
Admission: RE | Admit: 2024-07-06 | Discharge: 2024-07-06 | Disposition: A | Source: Ambulatory Visit | Attending: Family Medicine | Admitting: Family Medicine

## 2024-07-06 ENCOUNTER — Encounter: Payer: Self-pay | Admitting: Family Medicine

## 2024-07-06 ENCOUNTER — Ambulatory Visit (INDEPENDENT_AMBULATORY_CARE_PROVIDER_SITE_OTHER): Admitting: Family Medicine

## 2024-07-06 VITALS — BP 122/64 | HR 73 | Temp 97.9°F | Ht 62.0 in | Wt 163.6 lb

## 2024-07-06 DIAGNOSIS — J9809 Other diseases of bronchus, not elsewhere classified: Secondary | ICD-10-CM | POA: Diagnosis not present

## 2024-07-06 DIAGNOSIS — M810 Age-related osteoporosis without current pathological fracture: Secondary | ICD-10-CM

## 2024-07-06 DIAGNOSIS — Z0000 Encounter for general adult medical examination without abnormal findings: Secondary | ICD-10-CM

## 2024-07-06 DIAGNOSIS — R053 Chronic cough: Secondary | ICD-10-CM

## 2024-07-06 DIAGNOSIS — Z0001 Encounter for general adult medical examination with abnormal findings: Secondary | ICD-10-CM | POA: Diagnosis not present

## 2024-07-06 DIAGNOSIS — R5382 Chronic fatigue, unspecified: Secondary | ICD-10-CM | POA: Diagnosis not present

## 2024-07-06 DIAGNOSIS — E7800 Pure hypercholesterolemia, unspecified: Secondary | ICD-10-CM

## 2024-07-06 NOTE — Progress Notes (Signed)
 Subjective:    Patient ID: Alexa Allen, female    DOB: [birth year], 68 y.o.   MRN: [id number]  HPI Patient is a very pleasant 68 year old Caucasian female who is here today for complete physical exam.  She no longer needs a Pap smear as she has a history of TAH/BSO.  Patient had a colonoscopy in January 2025 which revealed diverticulosis but no malignancy.  She is not due for another colonoscopy until 2035.  Patient had a mammogram in September of this year that was normal.  She had a bone density test in 2023 that showed a T-score of -2.1 in the hip as well as a T-score of -3 in the forearm.  She is due to repeat this.  Regarding immunizations, patient is due for the shingles vaccine as well as a tetanus shot.  Other immunizations are up-to-date.  Patient politely declines the shingles vaccine.  She also declines a tetanus shot today.  She would like to repeat a bone density.  She is taking Fosamax  for her osteoporosis.  She is not taking calcium  and vitamin D.  She has a remote history of smoking.  She quit about 13 years ago.  Unfortunately she continues to have a cough almost on a daily basis.  The cough is worse first thing in the morning.  She brings up a lot of mucus and phlegm.  Once she finishes coughing that up the cough will subside.  She is already on acid reflux medication.  She does endorse postnasal drip and nasal drainage. Immunization History  Administered Date(s) Administered    sv, Bivalent, Protein Subunit Rsvpref,pf [place]) 06/01/2024   Fluad Quad(high Dose 65+) 05/30/2021   Fluad Trivalent(High Dose 65+) 07/03/2023   INFLUENZA, HIGH DOSE SEASONAL PF 06/22/2024   Influenza,inj,Quad PF,6+ Mos 05/18/2014, 05/15/2016, 07/06/2020   Influenza-Unspecified 05/22/2018, 06/22/2019   PFIZER(Purple Top)SARS-COV-2 Vaccination 04/19/2020, 05/10/2020   PNEUMOCOCCAL CONJUGATE-20 03/20/2022   Zoster, Live 05/16/2016    Past Medical History:  Diagnosis Date   Arthritis    hands    Elevated liver enzymes    GERD (gastroesophageal reflux disease)    HSV (herpes simplex virus) infection    Liver cyst    Osteoporosis    SVD (spontaneous vaginal delivery)    x 2   Wears partial dentures    upper   Past Surgical History:  Procedure Laterality Date   bladder tack     COLONOSCOPY     02/2008    MULTIPLE TOOTH EXTRACTIONS     TUBAL LIGATION     VAGINAL HYSTERECTOMY     WISDOM TOOTH EXTRACTION     Current Outpatient Medications on File Prior to Visit  Medication Sig Dispense Refill   acyclovir  (ZOVIRAX ) 400 MG tablet TAKE 1 TABLET BY MOUTH EVERY DAY 60 tablet 1   alendronate  (FOSAMAX ) 70 MG tablet TAKE 1 TABLET BY MOUTH ONCE A WEEK. TAKE WITH A FULL GLASS OF WATER ON AN EMPTY STOMACH 12 tablet 0   aspirin EC 81 MG tablet Take 81 mg by mouth every other day. Swallow whole.     atorvastatin  (LIPITOR) 20 MG tablet TAKE 1 TABLET BY MOUTH EVERY DAY 90 tablet 1   Cyanocobalamin (VITAMIN B 12 PO) Take 1 tablet by mouth daily.     Multiple Vitamin (MULTIVITAMIN) tablet Take 1 tablet by mouth daily.     omeprazole  (PRILOSEC) 20 MG capsule Take 1 capsule (20 mg total) by mouth daily. 90 capsule 1   TURMERIC-GINGER PO Take by  mouth daily at 6 (six) AM.     Current Facility-Administered Medications on File Prior to Visit  Medication Dose Route Frequency Provider Last Rate Last Admin   0.9 %  sodium chloride  infusion  500 mL Intravenous Once Abran Norleen SAILOR, MD       Allergies  Allergen Reactions   Iodinated Contrast Media Hives    (Iohexol) hives with IV cm- tolerated 1 hour quick prep well, Onset Date: 89907990   Oxycodone-Acetaminophen Other (See Comments)    Hallucinations and itching   Social History   Socioeconomic History   Marital status: Married    Spouse name: Not on file   Number of children: 2   Years of education: Not on file   Highest education level: GED or equivalent  Occupational History   Occupation: control and instrumentation engineer  Tobacco Use   Smoking status:  Former    Current packs/day: 0.25    Types: Cigarettes   Smokeless tobacco: Never   Tobacco comments:    Quit 2011  Vaping Use   Vaping status: Never Used  Substance and Sexual Activity   Alcohol use: Yes    Alcohol/week: 1.0 - 2.0 standard drink of alcohol    Types: 1 - 2 Cans of beer per week   Drug use: No   Sexual activity: Yes    Birth control/protection: Post-menopausal    Comment: Hysterectomy  Other Topics Concern   Not on file  Social History Narrative   Not on file   Social Drivers of Health   Financial Resource Strain: Low Risk  (07/02/2024)   Overall Financial Resource Strain (CARDIA)    Difficulty of Paying Living Expenses: Not very hard  Food Insecurity: Unknown (07/02/2024)   Hunger Vital Sign    Worried About Running Out of Food in the Last Year: Not on file    Ran Out of Food in the Last Year: Never true  Transportation Needs: No Transportation Needs (07/02/2024)   PRAPARE - Administrator, Civil Service (Medical): No    Lack of Transportation (Non-Medical): No  Physical Activity: Insufficiently Active (07/02/2024)   Exercise Vital Sign    Days of Exercise per Week: 2 days    Minutes of Exercise per Session: 10 min  Stress: No Stress Concern Present (07/02/2024)   [employer] of Occupational Health - Occupational Stress Questionnaire    Feeling of Stress: Only a little  Social Connections: Socially Integrated (07/02/2024)   Social Connection and Isolation Panel    Frequency of Communication with Friends and Family: More than three times a week    Frequency of Social Gatherings with Friends and Family: Once a week    Attends Religious Services: 1 to 4 times per year    Active Member of [employer] or Organizations: Yes    Attends Banker Meetings: 1 to 4 times per year    Marital Status: Married  Recent Concern: Social Connections - Moderately Isolated (05/13/2024)   Social Connection and Isolation Panel    Frequency of  Communication with Friends and Family: Three times a week    Frequency of Social Gatherings with Friends and Family: Twice a week    Attends Religious Services: Never    Database Administrator or Organizations: No    Attends Banker Meetings: Never    Marital Status: Married  Catering Manager Violence: Not At Risk (05/13/2024)   Humiliation, Afraid, Rape, and Kick questionnaire    Fear of Current or Ex-Partner:  No    Emotionally Abused: No    Physically Abused: No    Sexually Abused: No     Review of Systems  All other systems reviewed and are negative.      Objective:   Physical Exam Vitals reviewed.  Constitutional:      General: She is not in acute distress.    Appearance: She is well-developed. She is not diaphoretic.  HENT:     Head: Normocephalic and atraumatic.     Right Ear: External ear normal.     Left Ear: External ear normal.     Nose: Nose normal.     Mouth/Throat:     Pharynx: No oropharyngeal exudate.  Eyes:     General: No scleral icterus.       Right eye: No discharge.        Left eye: No discharge.     Conjunctiva/sclera: Conjunctivae normal.     Pupils: Pupils are equal, round, and reactive to light.  Neck:     Thyroid: No thyromegaly.     Vascular: No JVD.     Trachea: No tracheal deviation.  Cardiovascular:     Rate and Rhythm: Normal rate and regular rhythm.     Heart sounds: Normal heart sounds. No murmur heard.    No friction rub. No gallop.  Pulmonary:     Effort: Pulmonary effort is normal. No respiratory distress.     Breath sounds: Normal breath sounds. No stridor. No wheezing or rales.  Chest:     Chest wall: No tenderness.  Abdominal:     General: Bowel sounds are normal. There is no distension.     Palpations: Abdomen is soft. There is no mass.     Tenderness: There is no abdominal tenderness. There is no guarding or rebound.  Musculoskeletal:        General: No tenderness. Normal range of motion.     Cervical back:  Normal range of motion and neck supple.  Lymphadenopathy:     Cervical: No cervical adenopathy.  Skin:    General: Skin is warm.     Coloration: Skin is not pale.     Findings: No erythema or rash.  Neurological:     Mental Status: She is alert and oriented to person, place, and time.     Cranial Nerves: No cranial nerve deficit.     Motor: No abnormal muscle tone.     Coordination: Coordination normal.     Deep Tendon Reflexes: Reflexes are normal and symmetric.  Psychiatric:        Behavior: Behavior normal.        Thought Content: Thought content normal.        Judgment: Judgment normal.         Assessment & Plan:  General medical exam - Plan: CBC with Differential/Platelet, Comprehensive metabolic panel with GFR, Lipid panel, DG Bone Density  Osteoporosis without current pathological fracture, unspecified osteoporosis type - Plan: CBC with Differential/Platelet, Comprehensive metabolic panel with GFR, Lipid panel, DG Bone Density  Pure hypercholesterolemia - Plan: CBC with Differential/Platelet, Comprehensive metabolic panel with GFR, Lipid panel  Chronic fatigue - Plan: TSH  Chronic cough - Plan: DG Chest 2 View I believe the chronic cough is likely due to postnasal drip and nasal drainage versus early COPD.  Patient would like to get a chest x-ray for peace of mind.  If the x-ray is clear, I gave the patient the option between pulmonary function test versus a trial  of Flonase for postnasal drip.  Patient has elected that the chest x-ray is normal, she does not want to take any additional medication.  She does endorse some fatigue so we will check a TSH.  In addition I will check a CBC a CMP and a lipid panel.  I would like to see her LDL cholesterol less than 899.  I will also check a bone density test and if her T-scores are worsening, we could consider switching to Evenity.  I encouraged her to take calcium  1200 mg a day and vitamin D at 1000 units a day.  Also recommended the  tetanus shot and the shingles vaccine.  She politely declined these today

## 2024-07-07 LAB — CBC WITH DIFFERENTIAL/PLATELET
Absolute Lymphocytes: 1872 {cells}/uL (ref 850–3900)
Absolute Monocytes: 383 {cells}/uL (ref 200–950)
Basophils Absolute: 61 {cells}/uL (ref 0–200)
Basophils Relative: 1.2 %
Eosinophils Absolute: 199 {cells}/uL (ref 15–500)
Eosinophils Relative: 3.9 %
HCT: 43.5 % (ref 35.0–45.0)
Hemoglobin: 14.2 g/dL (ref 11.7–15.5)
MCH: 29.6 pg (ref 27.0–33.0)
MCHC: 32.6 g/dL (ref 32.0–36.0)
MCV: 90.6 fL (ref 80.0–100.0)
MPV: 11.9 fL (ref 7.5–12.5)
Monocytes Relative: 7.5 %
Neutro Abs: 2586 {cells}/uL (ref 1500–7800)
Neutrophils Relative %: 50.7 %
Platelets: 189 Thousand/uL (ref 140–400)
RBC: 4.8 Million/uL (ref 3.80–5.10)
RDW: 12.3 % (ref 11.0–15.0)
Total Lymphocyte: 36.7 %
WBC: 5.1 Thousand/uL (ref 3.8–10.8)

## 2024-07-07 LAB — LIPID PANEL
Cholesterol: 151 mg/dL (ref <200)
HDL: 68 mg/dL (ref >=50)
LDL Cholesterol (Calc): 63 mg/dL
Non-HDL Cholesterol (Calc): 83 mg/dL (ref <130)
Total CHOL/HDL Ratio: 2.2 (calc) (ref <5.0)
Triglycerides: 121 mg/dL (ref <150)

## 2024-07-07 LAB — COMPREHENSIVE METABOLIC PANEL WITH GFR
AG Ratio: 1.9 (calc) (ref 1.0–2.5)
ALT: 33 U/L — ABNORMAL HIGH (ref 6–29)
AST: 28 U/L (ref 10–35)
Albumin: 4.5 g/dL (ref 3.6–5.1)
Alkaline phosphatase (APISO): 60 U/L (ref 37–153)
BUN: 15 mg/dL (ref 7–25)
CO2: 26 mmol/L (ref 20–32)
Calcium: 9.3 mg/dL (ref 8.6–10.4)
Chloride: 103 mmol/L (ref 98–110)
Creat: 0.71 mg/dL (ref 0.50–1.05)
Globulin: 2.4 g/dL (ref 1.9–3.7)
Glucose, Bld: 98 mg/dL (ref 65–99)
Potassium: 4.6 mmol/L (ref 3.5–5.3)
Sodium: 138 mmol/L (ref 135–146)
Total Bilirubin: 0.5 mg/dL (ref 0.2–1.2)
Total Protein: 6.9 g/dL (ref 6.1–8.1)
eGFR: 93 mL/min/1.73m2 (ref >=60)

## 2024-07-07 LAB — TSH: TSH: 0.83 m[IU]/L (ref 0.40–4.50)

## 2024-07-08 ENCOUNTER — Ambulatory Visit: Payer: Self-pay | Admitting: Family Medicine

## 2024-07-13 ENCOUNTER — Encounter: Payer: Self-pay | Admitting: Family Medicine

## 2024-07-13 ENCOUNTER — Ambulatory Visit (INDEPENDENT_AMBULATORY_CARE_PROVIDER_SITE_OTHER): Admitting: Family Medicine

## 2024-07-13 VITALS — BP 122/76 | HR 74 | Temp 98.0°F | Ht 62.0 in | Wt 165.5 lb

## 2024-07-13 DIAGNOSIS — J209 Acute bronchitis, unspecified: Secondary | ICD-10-CM

## 2024-07-13 MED ORDER — AZITHROMYCIN 250 MG PO TABS: ORAL_TABLET | ORAL | 0 refills | Status: AC

## 2024-07-13 MED ORDER — BENZONATATE 100 MG PO CAPS: 100.0000 mg | ORAL_CAPSULE | Freq: Two times a day (BID) | ORAL | 0 refills | Status: AC | PRN

## 2024-07-13 MED ORDER — METHYLPREDNISOLONE ACETATE 80 MG/ML IJ SUSP
80.0000 mg | Freq: Once | INTRAMUSCULAR | Status: AC
  Administered 2024-07-13: 80 mg via INTRAMUSCULAR

## 2024-07-13 MED ORDER — PREDNISONE 10 MG (21) PO TBPK
ORAL_TABLET | ORAL | 0 refills | Status: AC
Start: 2024-07-13 — End: ?

## 2024-07-13 NOTE — Progress Notes (Signed)
 Patient Office Visit  Assessment & Plan:  Acute bronchitis, unspecified organism -     methylPREDNISolone  Acetate -     predniSONE ; Use as directed.  Dispense: 21 each; Refill: 0 -     Benzonatate ; Take 1 capsule (100 mg total) by mouth 2 (two) times daily as needed for cough.  Dispense: 30 capsule; Refill: 0 -     Azithromycin ; Take 2 tablets on day 1, then 1 tablet daily on days 2 through 5  Dispense: 6 tablet; Refill: 0   Assessment and Plan    Acute bronchitis with postnasal drip and possible allergic rhinitis Chronic cough and congestion with thick sputum. Negative chest x-ray. Possible postnasal drip and COPD. Differential includes asthma, bronchitis, and allergic rhinitis. Suspected allergic component. - Administered prednisone  injection. - Prescribed prednisone  for six days. - Prescribed Tessalon  Perles PRN for cough. - Prescribed antibiotics as precaution. - Recommended OTC allergy medications trial.      Return if symptoms worsen or fail to improve.   Subjective:    Patient ID: Alexa Allen, female    DOB: November 06, 1955  Age: 68 y.o. MRN: [id number]  Chief Complaint  Patient presents with   Nasal Congestion    Pt states that she has had congestion since July. She states that she is worse in the mornings. She would like a steroid shot.    HPI Discussed the use of AI scribe software for clinical note transcription with the patient, who gave verbal consent to proceed.  History of Present Illness        History of Present Illness Alexa Allen is a 68 year old female who presents with a persistent cough and congestion since July.  She has experienced a persistent cough and congestion since July, characterized by difficulty in expectorating thick, slightly green mucus, particularly in the mornings. No wheezing, shortness of breath, fever, or chills are present. A chest x-ray previously performed did not show pneumonia.  She has a history of smoking but quit  many years ago, including during her pregnancies and after her father's death from throat cancer. She does not consider herself to have been a heavy smoker. She denies current use of allergy medications but acknowledges occasional sneezing and morning stuffiness. She has not been using any inhalers or specific cough medications regularly. She is not allergic to any medications except for Percocet.  She has a history of ear infections with some scarring noted in the past. The cough does not disturb her sleep, and she does not feel generally unwell, but finds the persistent symptoms bothersome and socially inconvenient.  Physical Exam HEENT: Postnasal drip present. CHEST: Lungs clear to auscultation bilaterally.  Results RADIOLOGY Chest x-ray: Findings may suggest asthma or bronchitis; no evidence of pneumonia.  Assessment and Plan Acute bronchitis with postnasal drip and possible allergic rhinitis Chronic cough and congestion with thick sputum. Negative chest x-ray. Possible postnasal drip and COPD. Differential includes asthma, bronchitis, and allergic rhinitis. Suspected allergic component. - Administered prednisone  injection. - Prescribed prednisone  for six days. - Prescribed Tessalon  Perles PRN for cough. - Prescribed antibiotics as precaution. - Recommended OTC allergy medications trial.    The 10-year ASCVD risk score (Arnett DK, et al., 2019) is: 5.9%  Past Medical History:  Diagnosis Date   Arthritis    hands   Elevated liver enzymes    GERD (gastroesophageal reflux disease)    HSV (herpes simplex virus) infection    Liver cyst    Osteoporosis    SVD (  spontaneous vaginal delivery)    x 2   Wears partial dentures    upper   Past Surgical History:  Procedure Laterality Date   bladder tack     COLONOSCOPY     02/2008    MULTIPLE TOOTH EXTRACTIONS     TUBAL LIGATION     VAGINAL HYSTERECTOMY     WISDOM TOOTH EXTRACTION     Social History   Tobacco Use   Smoking  status: Former    Current packs/day: 0.25    Types: Cigarettes   Smokeless tobacco: Never   Tobacco comments:    Quit 2011  Vaping Use   Vaping status: Never Used  Substance Use Topics   Alcohol use: Yes    Alcohol/week: 1.0 - 2.0 standard drink of alcohol    Types: 1 - 2 Cans of beer per week   Drug use: No   Family History  Problem Relation Age of Onset   COPD Mother    Esophageal cancer Father    Throat cancer Father    Cancer Father    Arthritis Sister    Colon cancer Neg Hx    Rectal cancer Neg Hx    Stomach cancer Neg Hx    Breast cancer Neg Hx    Colon polyps Neg Hx    Allergies  Allergen Reactions   Iodinated Contrast Media Hives    (Iohexol) hives with IV cm- tolerated 1 hour quick prep well, Onset Date: 89907990   Oxycodone-Acetaminophen Other (See Comments)    Hallucinations and itching    ROS    Objective:    BP 122/76   Pulse 74   Temp 98 F (36.7 C)   Ht 5' 2 (1.575 m)   Wt 165 lb 8 oz (75.1 kg)   LMP  (LMP Unknown)   SpO2 97%   BMI 30.27 kg/m  BP Readings from Last 3 Encounters:  07/13/24 122/76  07/06/24 122/64  09/02/23 118/65   Wt Readings from Last 3 Encounters:  07/13/24 165 lb 8 oz (75.1 kg)  07/06/24 163 lb 9.6 oz (74.2 kg)  05/13/24 163 lb (73.9 kg)    Physical Exam Vitals and nursing note reviewed.  Constitutional:      General: She is not in acute distress.    Appearance: Normal appearance.  HENT:     Head: Normocephalic.     Right Ear: Tympanic membrane, ear canal and external ear normal.     Left Ear: Tympanic membrane, ear canal and external ear normal. There is no impacted cerumen.     Mouth/Throat:     Pharynx: No oropharyngeal exudate or posterior oropharyngeal erythema.     Comments: Postnasal drip Eyes:     Extraocular Movements: Extraocular movements intact.     Conjunctiva/sclera: Conjunctivae normal.     Pupils: Pupils are equal, round, and reactive to light.  Cardiovascular:     Rate and Rhythm: Normal  rate and regular rhythm.     Heart sounds: Normal heart sounds.  Pulmonary:     Effort: Pulmonary effort is normal.     Breath sounds: Normal breath sounds.  Musculoskeletal:     Right lower leg: No edema.     Left lower leg: No edema.  Neurological:     General: No focal deficit present.     Mental Status: She is alert and oriented to person, place, and time.  Psychiatric:        Mood and Affect: Mood normal.  Behavior: Behavior normal.        Thought Content: Thought content normal.        Judgment: Judgment normal.      No results found for any visits on 07/13/24.

## 2024-08-15 ENCOUNTER — Other Ambulatory Visit: Payer: Self-pay | Admitting: Family Medicine

## 2024-08-20 ENCOUNTER — Telehealth: Payer: Self-pay

## 2024-08-20 NOTE — Telephone Encounter (Signed)
 Copied from CRM [id number]. Topic: Clinical - Pink Word Triage >> Aug 20, 2024 12:25 PM Olam RAMAN wrote: [place] Word triggered transfer to Nurse Triage. See Triage Message for details. >> Aug 20, 2024 12:25 PM Olam RAMAN wrote: Reason for Triage: Pt has a cyst on R middle finger, big with some pain below knuckle REFUSED NT

## 2024-08-20 NOTE — Telephone Encounter (Signed)
 This encounter was created in error - please disregard.

## 2024-08-23 ENCOUNTER — Ambulatory Visit: Admitting: Family Medicine

## 2024-08-23 ENCOUNTER — Encounter: Payer: Self-pay | Admitting: Family Medicine

## 2024-08-23 VITALS — BP 118/72 | HR 76 | Temp 98.1°F | Ht 62.0 in | Wt 164.0 lb

## 2024-08-23 DIAGNOSIS — M67449 Ganglion, unspecified hand: Secondary | ICD-10-CM

## 2024-08-23 NOTE — Progress Notes (Signed)
 "  Subjective:    Patient ID: Alexa Allen, female    DOB: [birth year], 69 y.o.   MRN: [id number]  Hand Injury   Shortness of Breath   Patient has an elliptical clear cystlike mass adjacent to the right third PIP joint.  The joint has pain with range of motion and limited range of motion.  The cyst appears to be a ganglion cyst versus a digital mucous cyst.  Patient reports tenderness and discomfort and would like this area removed.  She also recently had an episode where she awoke from a deep sleep unable to breathe.  She states that she felt like she was choking and like her airway had closed off.  This lasted seconds to 1 minute and then she was able to gradually resume her normal breathing.  Symptoms sound like she may have had laryngospasm likely related to acid reflux.  She denies any chest pain or palpitations or syncope or near syncope   Past Medical History:  Diagnosis Date   Arthritis    hands   Elevated liver enzymes    GERD (gastroesophageal reflux disease)    HSV (herpes simplex virus) infection    Liver cyst    Osteoporosis    SVD (spontaneous vaginal delivery)    x 2   Wears partial dentures    upper   Past Surgical History:  Procedure Laterality Date   bladder tack     COLONOSCOPY     02/2008    MULTIPLE TOOTH EXTRACTIONS     TUBAL LIGATION     VAGINAL HYSTERECTOMY     WISDOM TOOTH EXTRACTION     Current Outpatient Medications on File Prior to Visit  Medication Sig Dispense Refill   acyclovir  (ZOVIRAX ) 400 MG tablet TAKE 1 TABLET BY MOUTH EVERY DAY 60 tablet 1   alendronate  (FOSAMAX ) 70 MG tablet TAKE 1 TABLET BY MOUTH ONCE A WEEK. TAKE WITH A FULL GLASS OF WATER ON AN EMPTY STOMACH 12 tablet 0   aspirin EC 81 MG tablet Take 81 mg by mouth every other day. Swallow whole.     atorvastatin  (LIPITOR) 20 MG tablet TAKE 1 TABLET BY MOUTH EVERY DAY 90 tablet 1   benzonatate  (TESSALON ) 100 MG capsule Take 1 capsule (100 mg total) by mouth 2 (two) times daily as  needed for cough. 30 capsule 0   Cyanocobalamin (VITAMIN B 12 PO) Take 1 tablet by mouth daily.     Multiple Vitamin (MULTIVITAMIN) tablet Take 1 tablet by mouth daily.     omeprazole  (PRILOSEC) 20 MG capsule Take 1 capsule (20 mg total) by mouth daily. 90 capsule 1   predniSONE  (STERAPRED UNI-PAK 21 TAB) 10 MG (21) TBPK tablet Use as directed. 21 each 0   TURMERIC-GINGER PO Take by mouth daily at 6 (six) AM.     Current Facility-Administered Medications on File Prior to Visit  Medication Dose Route Frequency Provider Last Rate Last Admin   0.9 %  sodium chloride  infusion  500 mL Intravenous Once Abran Norleen SAILOR, MD       Allergies  Allergen Reactions   Iodinated Contrast Media Hives    (Iohexol) hives with IV cm- tolerated 1 hour quick prep well, Onset Date: 89907990   Oxycodone-Acetaminophen Other (See Comments)    Hallucinations and itching   Social History   Socioeconomic History   Marital status: Married    Spouse name: Not on file   Number of children: 2   Years of education: Not on  file   Highest education level: GED or equivalent  Occupational History   Occupation: control and instrumentation engineer  Tobacco Use   Smoking status: Former    Current packs/day: 0.25    Types: Cigarettes   Smokeless tobacco: Never   Tobacco comments:    Quit 2011  Vaping Use   Vaping status: Never Used  Substance and Sexual Activity   Alcohol use: Yes    Alcohol/week: 1.0 - 2.0 standard drink of alcohol    Types: 1 - 2 Cans of beer per week   Drug use: No   Sexual activity: Yes    Birth control/protection: Post-menopausal    Comment: Hysterectomy  Other Topics Concern   Not on file  Social History Narrative   Not on file   Social Drivers of Health   Tobacco Use: Medium Risk (08/23/2024)   Patient History    Smoking Tobacco Use: Former    Smokeless Tobacco Use: Never    Passive Exposure: Not on file  Financial Resource Strain: Low Risk (07/02/2024)   Overall Financial Resource Strain (CARDIA)     Difficulty of Paying Living Expenses: Not very hard  Food Insecurity: Unknown (07/02/2024)   Epic    Worried About Programme Researcher, Broadcasting/film/video in the Last Year: Not on file    [employer] of Food in the Last Year: Never true  Transportation Needs: No Transportation Needs (07/02/2024)   Epic    Lack of Transportation (Medical): No    Lack of Transportation (Non-Medical): No  Physical Activity: Insufficiently Active (07/02/2024)   Exercise Vital Sign    Days of Exercise per Week: 2 days    Minutes of Exercise per Session: 10 min  Stress: No Stress Concern Present (07/02/2024)   [employer] of Occupational Health - Occupational Stress Questionnaire    Feeling of Stress: Only a little  Social Connections: Socially Integrated (07/02/2024)   Social Connection and Isolation Panel    Frequency of Communication with Friends and Family: More than three times a week    Frequency of Social Gatherings with Friends and Family: Once a week    Attends Religious Services: 1 to 4 times per year    Active Member of [employer] or Organizations: Yes    Attends Banker Meetings: 1 to 4 times per year    Marital Status: Married  Recent Concern: Social Connections - Moderately Isolated (05/13/2024)   Social Connection and Isolation Panel    Frequency of Communication with Friends and Family: Three times a week    Frequency of Social Gatherings with Friends and Family: Twice a week    Attends Religious Services: Never    Database Administrator or Organizations: No    Attends Banker Meetings: Never    Marital Status: Married  Catering Manager Violence: Not At Risk (05/13/2024)   Epic    Fear of Current or Ex-Partner: No    Emotionally Abused: No    Physically Abused: No    Sexually Abused: No  Depression (PHQ2-9): Low Risk (07/06/2024)   Depression (PHQ2-9)    PHQ-2 Score: 0  Alcohol Screen: Low Risk (07/02/2024)   Alcohol Screen    Last Alcohol Screening Score (AUDIT): 1  Housing:  Unknown (07/02/2024)   Epic    Unable to Pay for Housing in the Last Year: No    Number of Times Moved in the Last Year: Not on file    Homeless in the Last Year: No  Utilities: Not At Risk (  05/13/2024)   Epic    Threatened with loss of utilities: No  Health Literacy: Adequate Health Literacy (05/13/2024)   B1300 Health Literacy    Frequency of need for help with medical instructions: Never     Review of Systems  Respiratory:  Positive for shortness of breath.   All other systems reviewed and are negative.      Objective:   Physical Exam Vitals reviewed.  Constitutional:      General: She is not in acute distress.    Appearance: She is well-developed. She is not diaphoretic.  HENT:     Head: Normocephalic and atraumatic.     Right Ear: External ear normal.     Left Ear: External ear normal.     Nose: Nose normal.     Mouth/Throat:     Pharynx: No oropharyngeal exudate.  Eyes:     General: No scleral icterus.       Right eye: No discharge.        Left eye: No discharge.     Conjunctiva/sclera: Conjunctivae normal.     Pupils: Pupils are equal, round, and reactive to light.  Neck:     Thyroid: No thyromegaly.     Vascular: No JVD.     Trachea: No tracheal deviation.  Cardiovascular:     Rate and Rhythm: Normal rate and regular rhythm.     Heart sounds: Normal heart sounds. No murmur heard.    No friction rub. No gallop.  Pulmonary:     Effort: Pulmonary effort is normal. No respiratory distress.     Breath sounds: Normal breath sounds. No stridor. No wheezing or rales.  Chest:     Chest wall: No tenderness.  Abdominal:     General: Bowel sounds are normal. There is no distension.     Palpations: Abdomen is soft. There is no mass.     Tenderness: There is no abdominal tenderness. There is no guarding or rebound.  Musculoskeletal:     Right hand: Deformity and tenderness present. Decreased range of motion.     Cervical back: Normal range of motion and neck supple.   Lymphadenopathy:     Cervical: No cervical adenopathy.  Skin:    General: Skin is warm.     Coloration: Skin is not pale.     Findings: No erythema or rash.  Neurological:     Mental Status: She is alert and oriented to person, place, and time.     Cranial Nerves: No cranial nerve deficit.     Motor: No abnormal muscle tone.     Coordination: Coordination normal.     Deep Tendon Reflexes: Reflexes are normal and symmetric.  Psychiatric:        Behavior: Behavior normal.        Thought Content: Thought content normal.        Judgment: Judgment normal.         Assessment & Plan:  Digital mucous cyst - Plan: Ambulatory referral to Orthopedic Surgery Patient has a digital mucous cyst.  Consult orthopedics to have the lesion removed.  I believe the patient likely have laryngospasm related to acid reflux.  Recommended elevating the head of her bed to 30 degrees to prevent aspiration of stomach contents at night when she is supine "

## 2024-08-27 ENCOUNTER — Other Ambulatory Visit: Payer: Self-pay | Admitting: Family Medicine

## 2024-08-27 DIAGNOSIS — Z780 Asymptomatic menopausal state: Secondary | ICD-10-CM

## 2024-08-27 DIAGNOSIS — M810 Age-related osteoporosis without current pathological fracture: Secondary | ICD-10-CM

## 2024-09-11 ENCOUNTER — Other Ambulatory Visit: Payer: Self-pay | Admitting: Family Medicine

## 2024-09-13 NOTE — Telephone Encounter (Signed)
 Requested Prescriptions  Pending Prescriptions Disp Refills   acyclovir  (ZOVIRAX ) 400 MG tablet [Pharmacy Med Name: ACYCLOVIR  400 MG TABLET] 60 tablet 1    Sig: TAKE 1 TABLET BY MOUTH EVERY DAY     Antimicrobials:  Antiviral Agents - Anti-Herpetic Passed - 09/13/2024 11:49 AM      Passed - Valid encounter within last 12 months    Recent Outpatient Visits           3 weeks ago Digital mucous cyst   [facility] Duanne Butler DASEN, MD   2 months ago Acute bronchitis, unspecified organism   [place] [facility] Aletha Bene, MD   2 months ago General medical exam   [place] [facility] Duanne Butler DASEN, MD   1 year ago Pure hypercholesterolemia   [place] [facility] Duanne Butler DASEN, MD   1 year ago Acute bacterial sinusitis   [facility] Kayla Jeoffrey RAMAN, FNP

## 2024-09-17 ENCOUNTER — Ambulatory Visit: Payer: Self-pay

## 2024-09-17 NOTE — Telephone Encounter (Signed)
 FYI Only or Action Required?: FYI only for provider: appointment scheduled on 09/20/24.  Patient was last seen in primary care on 08/23/2024 by Duanne Butler DASEN, MD.  Called Nurse Triage reporting Dizziness.  Symptoms began a week ago.  Interventions attempted: Nothing.  Symptoms are: gradually worsening.  Triage Disposition: See PCP When Office is Open (Within 3 Days)  Patient/caregiver understands and will follow disposition?: Yes   Message from [facility] sent at 09/17/2024  3:29 PM EST  Reason for Triage: dizzy spells - sometimes can't breath     Reason for Disposition  [1] MILD dizziness (e.g., walking normally) AND [2] has NOT been evaluated by doctor (or NP/PA) for this  (Exception: Dizziness caused by heat exposure, sudden standing, or poor fluid intake.)  Answer Assessment - Initial Assessment Questions Pt called to report new dizziness with associated spinning. Pt states symptoms come and go, worse when she turns her head and bends over. Pt denies SOB, more heaviness at onset of symptoms, no nausea, no issues with BP. Pt reports having h/a a couple times a week, usually when she wakes up in the morning. Pt is not doing any interventions at this time. Discussed importance of safety and sitting down to rest at onset of symptoms, importance of fluid status. Pt voiced understanding. Appointment scheduled for evaluation. Patient agrees with plan of care, and will call back if anything changes, or if symptoms worsen.      1. DESCRIPTION: Describe your dizziness.     Room spinning, visual floaters but that is baseline   2. LIGHTHEADED: Do you feel lightheaded? (e.g., somewhat faint, woozy, weak upon standing)     Woozy, room spinning   3. VERTIGO: Do you feel like either you or the room is spinning or tilting? (i.e., vertigo)     Yes, even when laying down. States closing her eyes makes symptoms worse   4. SEVERITY: How bad is it?  Do you feel like you are going to  faint? Can you stand and walk?     No; pt denies any fainting   5. ONSET:  When did the dizziness begin?     Ongoing for the past week   6. AGGRAVATING FACTORS: Does anything make it worse? (e.g., standing, change in head position)     Changing head position, bending over    8. CAUSE: What do you think is causing the dizziness? (e.g., decreased fluids or food, diarrhea, emotional distress, heat exposure, new medicine, sudden standing, vomiting; unknown)     Unsure   9. RECURRENT SYMPTOM: Have you had dizziness before? If Yes, ask: When was the last time? What happened that time?     No   10. OTHER SYMPTOMS: Do you have any other symptoms? (e.g., fever, chest pain, vomiting, diarrhea, bleeding)       Head and chest heaviness  Protocols used: Dizziness - Lightheadedness-A-AH

## 2024-09-20 ENCOUNTER — Ambulatory Visit: Admitting: Family Medicine

## 2024-09-22 ENCOUNTER — Encounter: Payer: Self-pay | Admitting: Family Medicine

## 2024-09-22 ENCOUNTER — Ambulatory Visit: Admitting: Family Medicine

## 2024-09-22 VITALS — BP 134/82 | HR 80 | Temp 97.6°F | Ht 62.0 in | Wt 162.0 lb

## 2024-09-22 DIAGNOSIS — R296 Repeated falls: Secondary | ICD-10-CM

## 2024-09-22 DIAGNOSIS — G4452 New daily persistent headache (NDPH): Secondary | ICD-10-CM

## 2024-09-22 DIAGNOSIS — R42 Dizziness and giddiness: Secondary | ICD-10-CM

## 2024-09-22 NOTE — Progress Notes (Signed)
 "  Subjective:    Patient ID: Alexa Allen, female    DOB: 12-30-1955, 69 y.o.   MRN: [id number]  Patient states that for the last 3 to 4 months she has been falling more frequently.  She states that she has fallen 4 times.  She will lose her balance and fall forward for no reason.  She also gets dizzy almost on a daily basis.  This will come and go.  Typically the room will start spinning and she will experience vertigo especially if she bends over or leans forward.  Seems like this is positional vertigo.  However she also reports feeling lightheaded and dizzy at times even without position changes.  She denies any head trauma although she does report that she has been getting more frequent headaches.  She denies any syncope or near syncope.  She denies any cardiac arrhythmias.  She denies any neurologic deficits.  Today on exam cranial nerves II through XII are grossly intact with muscle strength 5/5 and equal and symmetric in the upper extremities.  Patient has normal finger-to-nose testing.  She has a negative Romberg sign.  I am unable to elicit vertigo with Dix-Hallpike maneuver   Past Medical History:  Diagnosis Date   Arthritis    hands   Elevated liver enzymes    GERD (gastroesophageal reflux disease)    HSV (herpes simplex virus) infection    Liver cyst    Osteoporosis    SVD (spontaneous vaginal delivery)    x 2   Wears partial dentures    upper   Past Surgical History:  Procedure Laterality Date   bladder tack     COLONOSCOPY     02/2008    MULTIPLE TOOTH EXTRACTIONS     TUBAL LIGATION     VAGINAL HYSTERECTOMY     WISDOM TOOTH EXTRACTION     Current Outpatient Medications on File Prior to Visit  Medication Sig Dispense Refill   acyclovir  (ZOVIRAX ) 400 MG tablet TAKE 1 TABLET BY MOUTH EVERY DAY 60 tablet 1   alendronate  (FOSAMAX ) 70 MG tablet TAKE 1 TABLET BY MOUTH ONCE A WEEK. TAKE WITH A FULL GLASS OF WATER ON AN EMPTY STOMACH 12 tablet 0   aspirin EC 81 MG tablet  Take 81 mg by mouth every other day. Swallow whole.     atorvastatin  (LIPITOR) 20 MG tablet TAKE 1 TABLET BY MOUTH EVERY DAY 90 tablet 1   benzonatate  (TESSALON ) 100 MG capsule Take 1 capsule (100 mg total) by mouth 2 (two) times daily as needed for cough. 30 capsule 0   Cyanocobalamin (VITAMIN B 12 PO) Take 1 tablet by mouth daily.     Multiple Vitamin (MULTIVITAMIN) tablet Take 1 tablet by mouth daily.     omeprazole  (PRILOSEC) 20 MG capsule Take 1 capsule (20 mg total) by mouth daily. 90 capsule 1   predniSONE  (STERAPRED UNI-PAK 21 TAB) 10 MG (21) TBPK tablet Use as directed. 21 each 0   TURMERIC-GINGER PO Take by mouth daily at 6 (six) AM.     Current Facility-Administered Medications on File Prior to Visit  Medication Dose Route Frequency Provider Last Rate Last Admin   0.9 %  sodium chloride  infusion  500 mL Intravenous Once Abran Norleen SAILOR, MD       Allergies  Allergen Reactions   Iodinated Contrast Media Hives    (Iohexol) hives with IV cm- tolerated 1 hour quick prep well, Onset Date: 89907990   Oxycodone-Acetaminophen Other (See Comments)  Hallucinations and itching   Social History   Socioeconomic History   Marital status: Married    Spouse name: Not on file   Number of children: 2   Years of education: Not on file   Highest education level: GED or equivalent  Occupational History   Occupation: control and instrumentation engineer  Tobacco Use   Smoking status: Former    Current packs/day: 0.25    Types: Cigarettes   Smokeless tobacco: Never   Tobacco comments:    Quit 2011  Vaping Use   Vaping status: Never Used  Substance and Sexual Activity   Alcohol use: Yes    Alcohol/week: 1.0 - 2.0 standard drink of alcohol    Types: 1 - 2 Cans of beer per week   Drug use: No   Sexual activity: Yes    Birth control/protection: Post-menopausal    Comment: Hysterectomy  Other Topics Concern   Not on file  Social History Narrative   Not on file   Social Drivers of Health   Tobacco Use:  Medium Risk (09/22/2024)   Patient History    Smoking Tobacco Use: Former    Smokeless Tobacco Use: Never    Passive Exposure: Not on file  Financial Resource Strain: Low Risk (07/02/2024)   Overall Financial Resource Strain (CARDIA)    Difficulty of Paying Living Expenses: Not very hard  Food Insecurity: Unknown (07/02/2024)   Epic    Worried About Programme Researcher, Broadcasting/film/video in the Last Year: Not on file    [employer] of Food in the Last Year: Never true  Transportation Needs: No Transportation Needs (07/02/2024)   Epic    Lack of Transportation (Medical): No    Lack of Transportation (Non-Medical): No  Physical Activity: Insufficiently Active (07/02/2024)   Exercise Vital Sign    Days of Exercise per Week: 2 days    Minutes of Exercise per Session: 10 min  Stress: No Stress Concern Present (07/02/2024)   [employer] of Occupational Health - Occupational Stress Questionnaire    Feeling of Stress: Only a little  Social Connections: Socially Integrated (07/02/2024)   Social Connection and Isolation Panel    Frequency of Communication with Friends and Family: More than three times a week    Frequency of Social Gatherings with Friends and Family: Once a week    Attends Religious Services: 1 to 4 times per year    Active Member of [employer] or Organizations: Yes    Attends Banker Meetings: 1 to 4 times per year    Marital Status: Married  Recent Concern: Social Connections - Moderately Isolated (05/13/2024)   Social Connection and Isolation Panel    Frequency of Communication with Friends and Family: Three times a week    Frequency of Social Gatherings with Friends and Family: Twice a week    Attends Religious Services: Never    Database Administrator or Organizations: No    Attends Banker Meetings: Never    Marital Status: Married  Catering Manager Violence: Not At Risk (05/13/2024)   Epic    Fear of Current or Ex-Partner: No    Emotionally Abused: No     Physically Abused: No    Sexually Abused: No  Depression (PHQ2-9): Low Risk (07/06/2024)   Depression (PHQ2-9)    PHQ-2 Score: 0  Alcohol Screen: Low Risk (07/02/2024)   Alcohol Screen    Last Alcohol Screening Score (AUDIT): 1  Housing: Unknown (07/02/2024)   Epic    Unable  to Pay for Housing in the Last Year: No    Number of Times Moved in the Last Year: Not on file    Homeless in the Last Year: No  Utilities: Not At Risk (05/13/2024)   Epic    Threatened with loss of utilities: No  Health Literacy: Adequate Health Literacy (05/13/2024)   B1300 Health Literacy    Frequency of need for help with medical instructions: Never     Review of Systems  Neurological:  Positive for dizziness.  All other systems reviewed and are negative.      Objective:   Physical Exam Vitals reviewed.  Constitutional:      General: She is not in acute distress.    Appearance: She is well-developed. She is not diaphoretic.  HENT:     Head: Normocephalic and atraumatic.     Right Ear: External ear normal.     Left Ear: External ear normal.     Nose: Nose normal.     Mouth/Throat:     Pharynx: No oropharyngeal exudate.  Eyes:     General: No scleral icterus.       Right eye: No discharge.        Left eye: No discharge.     Conjunctiva/sclera: Conjunctivae normal.     Pupils: Pupils are equal, round, and reactive to light.  Neck:     Thyroid: No thyromegaly.     Vascular: No JVD.     Trachea: No tracheal deviation.  Cardiovascular:     Rate and Rhythm: Normal rate and regular rhythm.     Heart sounds: Normal heart sounds. No murmur heard.    No friction rub. No gallop.  Pulmonary:     Effort: Pulmonary effort is normal. No respiratory distress.     Breath sounds: Normal breath sounds. No stridor. No wheezing or rales.  Chest:     Chest wall: No tenderness.  Abdominal:     General: Bowel sounds are normal. There is no distension.     Palpations: Abdomen is soft. There is no mass.      Tenderness: There is no abdominal tenderness. There is no guarding or rebound.  Musculoskeletal:        General: No tenderness. Normal range of motion.     Cervical back: Normal range of motion and neck supple.  Lymphadenopathy:     Cervical: No cervical adenopathy.  Skin:    General: Skin is warm.     Coloration: Skin is not pale.     Findings: No erythema or rash.  Neurological:     Mental Status: She is alert and oriented to person, place, and time.     Cranial Nerves: No cranial nerve deficit.     Motor: No abnormal muscle tone.     Coordination: Coordination normal.     Deep Tendon Reflexes: Reflexes are normal and symmetric.  Psychiatric:        Behavior: Behavior normal.        Thought Content: Thought content normal.        Judgment: Judgment normal.         Assessment & Plan:  Frequent falls  Dizziness  New daily persistent headache I believe the patient is having vertigo.  This would explain the room spinning when she leans forward.  However I cannot explain the new daily persistent headache.  I am also concerned by the frequency of falls.  Patient is 69 years old however she is in good physical conditioning.  Therefore I do not believe that her falls are related to age or cognitive decline.  I am concerned that she has fallen 4 times in the last 3 months especially given the daily headache.  For that reason I will order an MRI of the brain to evaluate further.  Neurologic exam is reassuring today "

## 2024-11-23 ENCOUNTER — Other Ambulatory Visit (HOSPITAL_BASED_OUTPATIENT_CLINIC_OR_DEPARTMENT_OTHER)

## 2025-05-19 ENCOUNTER — Ambulatory Visit

## 2025-07-07 ENCOUNTER — Encounter: Admitting: Family Medicine
